# Patient Record
Sex: Male | Born: 1985 | Race: Black or African American | Hispanic: No | Marital: Single | Smoking: Current every day smoker
Health system: Southern US, Community
[De-identification: ages and names within clinical notes are randomized; demographics above are authoritative.]

## PROBLEM LIST (undated history)

## (undated) ENCOUNTER — Ambulatory Visit (HOSPITAL_COMMUNITY): Admission: EM | Payer: Self-pay | Source: Home / Self Care

## (undated) DIAGNOSIS — F419 Anxiety disorder, unspecified: Secondary | ICD-10-CM

## (undated) DIAGNOSIS — G8929 Other chronic pain: Secondary | ICD-10-CM

## (undated) DIAGNOSIS — M549 Dorsalgia, unspecified: Secondary | ICD-10-CM

---
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
## 2008-03-11 ENCOUNTER — Emergency Department (HOSPITAL_COMMUNITY): Admission: EM | Admit: 2008-03-11 | Discharge: 2008-03-11 | Payer: Self-pay | Admitting: Emergency Medicine

## 2009-06-20 ENCOUNTER — Emergency Department (HOSPITAL_COMMUNITY): Admission: EM | Admit: 2009-06-20 | Discharge: 2009-06-20 | Payer: Self-pay | Admitting: Emergency Medicine

## 2011-11-01 ENCOUNTER — Encounter (HOSPITAL_COMMUNITY): Payer: Self-pay | Admitting: Emergency Medicine

## 2011-11-01 ENCOUNTER — Emergency Department (HOSPITAL_COMMUNITY)
Admission: EM | Admit: 2011-11-01 | Discharge: 2011-11-01 | Discharge status: Against Medical Advice | Payer: Self-pay | Attending: Emergency Medicine | Admitting: Emergency Medicine

## 2011-11-01 DIAGNOSIS — R109 Unspecified abdominal pain: Secondary | ICD-10-CM | POA: Insufficient documentation

## 2011-11-01 LAB — COMPREHENSIVE METABOLIC PANEL
ALT: 23 U/L (ref 0–53)
Albumin: 4.1 g/dL (ref 3.5–5.2)
BUN: 11 mg/dL (ref 6–23)
CO2: 28 mEq/L (ref 19–32)
Calcium: 9.5 mg/dL (ref 8.4–10.5)
Creatinine, Ser: 1.18 mg/dL (ref 0.50–1.35)
Glucose, Bld: 97 mg/dL (ref 70–99)
Potassium: 3.7 mEq/L (ref 3.5–5.1)
Total Bilirubin: 0.3 mg/dL (ref 0.3–1.2)
Total Protein: 7.5 g/dL (ref 6.0–8.3)

## 2011-11-01 LAB — CBC
HCT: 45.5 % (ref 39.0–52.0)
MCH: 30.3 pg (ref 26.0–34.0)
MCV: 87.2 fL (ref 78.0–100.0)
Platelets: 198 10*3/uL (ref 150–400)
RBC: 5.22 MIL/uL (ref 4.22–5.81)

## 2011-11-01 LAB — DIFFERENTIAL
Basophils Relative: 0 % (ref 0–1)
Lymphs Abs: 1.1 10*3/uL (ref 0.7–4.0)
Monocytes Relative: 5 % (ref 3–12)
Neutro Abs: 7.8 10*3/uL — ABNORMAL HIGH (ref 1.7–7.7)

## 2011-11-01 NOTE — ED Notes (Signed)
Pt. Called and no response.

## 2011-11-01 NOTE — ED Notes (Signed)
Pt presenting to ed with c/o at the gym and felt something pop while on the incline doing sit-ups pt states he had a weight ball. Pt states pain is all over his lower abdomen. Pt states positive nausea s/p feeling pop but he denies nausea and vomiting at this time. Pt denies diarrhea

## 2013-12-28 ENCOUNTER — Encounter (HOSPITAL_COMMUNITY): Payer: Self-pay | Admitting: Emergency Medicine

## 2013-12-28 ENCOUNTER — Emergency Department (HOSPITAL_COMMUNITY)
Admission: EM | Admit: 2013-12-28 | Discharge: 2013-12-28 | Disposition: A | Discharge status: Home / Self Care | Payer: Self-pay | Attending: Emergency Medicine | Admitting: Emergency Medicine

## 2013-12-28 DIAGNOSIS — F17200 Nicotine dependence, unspecified, uncomplicated: Secondary | ICD-10-CM | POA: Insufficient documentation

## 2013-12-28 DIAGNOSIS — K5900 Constipation, unspecified: Secondary | ICD-10-CM | POA: Insufficient documentation

## 2013-12-28 DIAGNOSIS — A5900 Urogenital trichomoniasis, unspecified: Secondary | ICD-10-CM | POA: Insufficient documentation

## 2013-12-28 DIAGNOSIS — M549 Dorsalgia, unspecified: Secondary | ICD-10-CM | POA: Insufficient documentation

## 2013-12-28 DIAGNOSIS — J029 Acute pharyngitis, unspecified: Secondary | ICD-10-CM | POA: Insufficient documentation

## 2013-12-28 DIAGNOSIS — R000 Tachycardia, unspecified: Secondary | ICD-10-CM | POA: Insufficient documentation

## 2013-12-28 LAB — CBC WITH DIFFERENTIAL/PLATELET
Basophils Absolute: 0 10*3/uL (ref 0.0–0.1)
Basophils Relative: 0 % (ref 0–1)
EOS PCT: 0 % (ref 0–5)
Eosinophils Absolute: 0 10*3/uL (ref 0.0–0.7)
HCT: 42.9 % (ref 39.0–52.0)
HEMOGLOBIN: 15 g/dL (ref 13.0–17.0)
LYMPHS PCT: 10 % — AB (ref 12–46)
Lymphs Abs: 1 10*3/uL (ref 0.7–4.0)
MCH: 30.6 pg (ref 26.0–34.0)
MCHC: 35 g/dL (ref 30.0–36.0)
MCV: 87.6 fL (ref 78.0–100.0)
MONO ABS: 1 10*3/uL (ref 0.1–1.0)
MONOS PCT: 10 % (ref 3–12)
NEUTROS ABS: 7.8 10*3/uL — AB (ref 1.7–7.7)
NEUTROS PCT: 80 % — AB (ref 43–77)
Platelets: 151 10*3/uL (ref 150–400)
RBC: 4.9 MIL/uL (ref 4.22–5.81)
RDW: 11.8 % (ref 11.5–15.5)
WBC: 9.8 10*3/uL (ref 4.0–10.5)

## 2013-12-28 LAB — BASIC METABOLIC PANEL
Anion gap: 14 (ref 5–15)
BUN: 9 mg/dL (ref 6–23)
CALCIUM: 9.1 mg/dL (ref 8.4–10.5)
CO2: 26 meq/L (ref 19–32)
Chloride: 97 mEq/L (ref 96–112)
Creatinine, Ser: 1.4 mg/dL — ABNORMAL HIGH (ref 0.50–1.35)
GFR, EST AFRICAN AMERICAN: 79 mL/min — AB (ref >90)
GFR, EST NON AFRICAN AMERICAN: 68 mL/min — AB (ref >90)
Glucose, Bld: 112 mg/dL — ABNORMAL HIGH (ref 70–99)
Potassium: 3.7 mEq/L (ref 3.7–5.3)
SODIUM: 137 meq/L (ref 137–147)

## 2013-12-28 LAB — URINALYSIS, ROUTINE W REFLEX MICROSCOPIC
Glucose, UA: NEGATIVE mg/dL
Hgb urine dipstick: NEGATIVE
KETONES UR: 15 mg/dL — AB
Nitrite: NEGATIVE
PH: 6 (ref 5.0–8.0)
Protein, ur: 100 mg/dL — AB
Specific Gravity, Urine: 1.022 (ref 1.005–1.030)
UROBILINOGEN UA: 1 mg/dL (ref 0.0–1.0)

## 2013-12-28 LAB — URINE MICROSCOPIC-ADD ON

## 2013-12-28 LAB — RAPID STREP SCREEN (MED CTR MEBANE ONLY): Streptococcus, Group A Screen (Direct): NEGATIVE

## 2013-12-28 MED ORDER — METRONIDAZOLE 500 MG PO TABS: 500.0000 mg | ORAL_TABLET | Freq: Two times a day (BID) | ORAL | Status: DC

## 2013-12-28 MED ORDER — KETOROLAC TROMETHAMINE 30 MG/ML IJ SOLN
30.0000 mg | Freq: Once | INTRAMUSCULAR | Status: AC
  Administered 2013-12-28: 30 mg via INTRAVENOUS
  Filled 2013-12-28: qty 1

## 2013-12-28 MED ORDER — PROMETHAZINE HCL 25 MG PO TABS: 25.0000 mg | ORAL_TABLET | Freq: Four times a day (QID) | ORAL | Status: DC | PRN

## 2013-12-28 MED ORDER — ONDANSETRON HCL 4 MG/2ML IJ SOLN
4.0000 mg | Freq: Once | INTRAMUSCULAR | Status: AC
  Administered 2013-12-28: 4 mg via INTRAVENOUS
  Filled 2013-12-28: qty 2

## 2013-12-28 MED ORDER — OXYCODONE-ACETAMINOPHEN 5-325 MG PO TABS: 1.0000 | ORAL_TABLET | Freq: Four times a day (QID) | ORAL | Status: DC | PRN

## 2013-12-28 MED ORDER — SODIUM CHLORIDE 0.9 % IV BOLUS (SEPSIS)
1000.0000 mL | Freq: Once | INTRAVENOUS | Status: AC
  Administered 2013-12-28: 1000 mL via INTRAVENOUS

## 2013-12-28 NOTE — Discharge Instructions (Signed)
Pharyngitis Pharyngitis is redness, pain, and swelling (inflammation) of your pharynx.  CAUSES  Pharyngitis is usually caused by infection. Most of the time, these infections are from viruses (viral) and are part of a cold. However, sometimes pharyngitis is caused by bacteria (bacterial). Pharyngitis can also be caused by allergies. Viral pharyngitis may be spread from person to person by coughing, sneezing, and personal items or utensils (cups, forks, spoons, toothbrushes). Bacterial pharyngitis may be spread from person to person by more intimate contact, such as kissing.  SIGNS AND SYMPTOMS  Symptoms of pharyngitis include:   Sore throat.   Tiredness (fatigue).   Low-grade fever.   Headache.  Joint pain and muscle aches.  Skin rashes.  Swollen lymph nodes.  Plaque-like film on throat or tonsils (often seen with bacterial pharyngitis). DIAGNOSIS  Your health care provider will ask you questions about your illness and your symptoms. Your medical history, along with a physical exam, is often all that is needed to diagnose pharyngitis. Sometimes, a rapid strep test is done. Other lab tests may also be done, depending on the suspected cause.  TREATMENT  Viral pharyngitis will usually get better in 3-4 days without the use of medicine. Bacterial pharyngitis is treated with medicines that kill germs (antibiotics).  HOME CARE INSTRUCTIONS   Drink enough water and fluids to keep your urine clear or pale yellow.   Only take over-the-counter or prescription medicines as directed by your health care provider:   If you are prescribed antibiotics, make sure you finish them even if you start to feel better.   Do not take aspirin.   Get lots of rest.   Gargle with 8 oz of salt water ( tsp of salt per 1 qt of water) as often as every 1-2 hours to soothe your throat.   Throat lozenges (if you are not at risk for choking) or sprays may be used to soothe your throat. SEEK MEDICAL  CARE IF:   You have large, tender lumps in your neck.  You have a rash.  You cough up green, yellow-brown, or bloody spit. SEEK IMMEDIATE MEDICAL CARE IF:   Your neck becomes stiff.  You drool or are unable to swallow liquids.  You vomit or are unable to keep medicines or liquids down.  You have severe pain that does not go away with the use of recommended medicines.  You have trouble breathing (not caused by a stuffy nose). MAKE SURE YOU:   Understand these instructions.  Will watch your condition.  Will get help right away if you are not doing well or get worse. Document Released: 05/03/2005 Document Revised: 02/21/2013 Document Reviewed: 01/08/2013 [facility] Patient Information 2015 [place], [place]. This information is not intended to replace advice given to you by your health care provider. Make sure you discuss any questions you have with your health care provider.  Trichomoniasis Trichomoniasis is an infection caused by an organism called Trichomonas. The infection can affect both women and men. In women, the outer male genitalia and the vagina are affected. In men, the penis is mainly affected, but the prostate and other reproductive organs can also be involved. Trichomoniasis is a sexually transmitted infection (STI) and is most often passed to another person through sexual contact.  RISK FACTORS  Having unprotected sexual intercourse.  Having sexual intercourse with an infected partner. SIGNS AND SYMPTOMS  Symptoms of trichomoniasis in women include:  Abnormal gray-green frothy vaginal discharge.  Itching and irritation of the vagina.  Itching and irritation  of the area outside the vagina. Symptoms of trichomoniasis in men include:   Penile discharge with or without pain.  Pain during urination. This results from inflammation of the urethra. DIAGNOSIS  Trichomoniasis may be found during a Pap test or physical exam. Your health care provider may use one of  the following methods to help diagnose this infection:  Examining vaginal discharge under a microscope. For men, urethral discharge would be examined.  Testing the pH of the vagina with a test tape.  Using a vaginal swab test that checks for the Trichomonas organism. A test is available that provides results within a few minutes.  Doing a culture test for the organism. This is not usually needed. TREATMENT   You may be given medicine to fight the infection. Women should inform their health care provider if they could be or are pregnant. Some medicines used to treat the infection should not be taken during pregnancy.  Your health care provider may recommend over-the-counter medicines or creams to decrease itching or irritation.  Your sexual partner will need to be treated if infected. HOME CARE INSTRUCTIONS   Take medicines only as directed by your health care provider.  Take over-the-counter medicine for itching or irritation as directed by your health care provider.  Do not have sexual intercourse while you have the infection.  Women should not douche or wear tampons while they have the infection.  Discuss your infection with your partner. Your partner may have gotten the infection from you, or you may have gotten it from your partner.  Have your sex partner get examined and treated if necessary.  Practice safe, informed, and protected sex.  See your health care provider for other STI testing. SEEK MEDICAL CARE IF:   You still have symptoms after you finish your medicine.  You develop abdominal pain.  You have pain when you urinate.  You have bleeding after sexual intercourse.  You develop a rash.  Your medicine makes you sick or makes you throw up (vomit). MAKE SURE YOU:  Understand these instructions.  Will watch your condition.  Will get help right away if you are not doing well or get worse. Document Released: 10/27/2000 Document Revised: 09/17/2013 Document  Reviewed: 02/12/2013 [facility] Patient Information 2015 [place]. This information is not intended to replace advice given to you by your health care provider. Make sure you discuss any questions you have with your health care provider.

## 2013-12-28 NOTE — ED Notes (Signed)
Pt presents with lower back pain, sore throat and a headache x4 days. Pt ambulatory in triage with a steady gait but a slight limp noted. Pt reports his limp is due to his back pain. Pt had a BM today but states he feels like he still "needs to go." pt denies problems with urination

## 2013-12-28 NOTE — ED Provider Notes (Addendum)
CSN: [id number]     Arrival date & time 12/28/13  1454 History   First MD Initiated Contact with Patient 12/28/13 1908     Chief Complaint  Patient presents with  . Back Pain  . Sore Throat  . Headache     (Consider location/radiation/quality/duration/timing/severity/associated sxs/prior Treatment) Patient is a 28 y.o. male presenting with back pain, pharyngitis, and headaches. The history is provided by the patient.  Back Pain Associated symptoms: fever and headaches   Associated symptoms: no abdominal pain, no chest pain, no numbness and no weakness   Sore Throat Associated symptoms include headaches. Pertinent negatives include no chest pain, no abdominal pain and no shortness of breath.  Headache Associated symptoms: back pain, fatigue, fever, myalgias and sore throat   Associated symptoms: no abdominal pain, no cough, no diarrhea, no pain, no nausea, no neck stiffness, no numbness and no vomiting    patient with multiple complaints the last few days. Started with a sore throat. He is also had some nausea or vomiting. He set some constipation. He is back pain. Headaches. He has myalgias. He's had fevers. He states someone else he works with has a sore throat also. No difficulty breathing.  History reviewed. No pertinent past medical history. History reviewed. No pertinent past surgical history. History reviewed. No pertinent family history. History  Substance Use Topics  . Smoking status: Current Some Day Smoker    Types: Cigarettes  . Smokeless tobacco: Not on file  . Alcohol Use: Yes     Comment: occassionally    Review of Systems  Constitutional: Positive for fever, appetite change and fatigue. Negative for activity change.  HENT: Positive for sore throat.   Eyes: Negative for pain.  Respiratory: Negative for cough, chest tightness and shortness of breath.   Cardiovascular: Negative for chest pain and leg swelling.  Gastrointestinal: Negative for nausea, vomiting,  abdominal pain and diarrhea.  Genitourinary: Negative for flank pain.  Musculoskeletal: Positive for back pain and myalgias. Negative for neck stiffness.  Skin: Negative for rash.  Neurological: Positive for headaches. Negative for weakness and numbness.  Psychiatric/Behavioral: Negative for behavioral problems.      Allergies  Review of patient's allergies indicates no known allergies.  Home Medications   Prior to Admission medications   Medication Sig Start Date End Date Taking? Authorizing Provider  Pseudoeph-CPM-DM-APAP (THERAFLU FLU/COLD/COUGH PO) Take 30 mLs by mouth daily as needed (for cold).   Yes Historical Provider, MD   BP 116/49  Pulse 63  Temp(Src) 99.7 F (37.6 C) (Oral)  Resp 18  Ht 6' (1.829 m)  Wt 160 lb 1.6 oz (72.621 kg)  BMI 21.71 kg/m2  SpO2 100% Physical Exam  Nursing note and vitals reviewed. Constitutional: He is oriented to person, place, and time. He appears well-developed and well-nourished.  Patient appears uncomfortable  HENT:  Head: Normocephalic and atraumatic.  Posterior pharyngeal erythema without exudate.  Eyes: EOM are normal. Pupils are equal, round, and reactive to light.  Neck: Normal range of motion. Neck supple.  Cardiovascular: Regular rhythm and normal heart sounds.   No murmur heard. Mild tachycardia  Pulmonary/Chest: Effort normal and breath sounds normal.  Abdominal: Soft. Bowel sounds are normal. He exhibits no distension and no mass. There is no tenderness. There is no rebound and no guarding.  Musculoskeletal: Normal range of motion. He exhibits no edema.  Neurological: He is alert and oriented to person, place, and time. No cranial nerve deficit.  Skin: Skin is warm and dry.  Psychiatric: He has a normal mood and affect.    ED Course  Procedures (including critical care time) Labs Review Labs Reviewed  CBC WITH DIFFERENTIAL - Abnormal; Notable for the following:    Neutrophils Relative % 80 (*)    Neutro Abs 7.8  (*)    Lymphocytes Relative 10 (*)    All other components within normal limits  BASIC METABOLIC PANEL - Abnormal; Notable for the following:    Glucose, Bld 112 (*)    Creatinine, Ser 1.40 (*)    GFR calc non Af Amer 68 (*)    GFR calc Af Amer 79 (*)    All other components within normal limits  URINALYSIS, ROUTINE W REFLEX MICROSCOPIC - Abnormal; Notable for the following:    Color, Urine AMBER (*)    APPearance CLOUDY (*)    Bilirubin Urine SMALL (*)    Ketones, ur 15 (*)    Protein, ur 100 (*)    Leukocytes, UA TRACE (*)    All other components within normal limits  URINE MICROSCOPIC-ADD ON - Abnormal; Notable for the following:    Squamous Epithelial / LPF FEW (*)    Bacteria, UA FEW (*)    All other components within normal limits  RAPID STREP SCREEN  CULTURE, GROUP A STREP    Imaging Review No results found.   EKG Interpretation None      MDM   Final diagnoses:  None     patient with likely viral syndrome/pharyngitis feels better after treatment. He has tolerated orals. Will discharge home with antiemetics and pain medication. Negative strep test     Juliet Rude. Rubin Payor, MD 12/28/13 2124  Patient has trich in the urine. States that his wife has had the same thing. Will give antibiotics. Patient was instructed on followup for other STDs  Juliet Rude. Rubin Payor, MD 12/28/13 2129

## 2013-12-28 NOTE — ED Notes (Signed)
Pt also reports unable to eat or drink, fever and nights sweats since Tuesday

## 2013-12-28 NOTE — ED Notes (Signed)
Pt given ginger ale.  IV infusion without difficulty.  Main c/o back pain and sore throat. + low grade temp.

## 2013-12-30 LAB — URINE CULTURE
CULTURE: NO GROWTH
Colony Count: NO GROWTH

## 2013-12-30 LAB — CULTURE, GROUP A STREP

## 2014-12-02 ENCOUNTER — Encounter (HOSPITAL_COMMUNITY): Payer: Self-pay

## 2014-12-02 ENCOUNTER — Emergency Department (HOSPITAL_COMMUNITY)
Admission: EM | Admit: 2014-12-02 | Discharge: 2014-12-03 | Disposition: A | Discharge status: Home / Self Care | Payer: Self-pay | Attending: Emergency Medicine | Admitting: Emergency Medicine

## 2014-12-02 DIAGNOSIS — F1994 Other psychoactive substance use, unspecified with psychoactive substance-induced mood disorder: Secondary | ICD-10-CM | POA: Diagnosis present

## 2014-12-02 DIAGNOSIS — F419 Anxiety disorder, unspecified: Secondary | ICD-10-CM | POA: Insufficient documentation

## 2014-12-02 DIAGNOSIS — R45851 Suicidal ideations: Secondary | ICD-10-CM

## 2014-12-02 DIAGNOSIS — F12188 Cannabis abuse with other cannabis-induced disorder: Secondary | ICD-10-CM | POA: Insufficient documentation

## 2014-12-02 DIAGNOSIS — Z720 Tobacco use: Secondary | ICD-10-CM | POA: Insufficient documentation

## 2014-12-02 DIAGNOSIS — F1510 Other stimulant abuse, uncomplicated: Secondary | ICD-10-CM | POA: Insufficient documentation

## 2014-12-02 DIAGNOSIS — F22 Delusional disorders: Secondary | ICD-10-CM | POA: Insufficient documentation

## 2014-12-02 DIAGNOSIS — F1910 Other psychoactive substance abuse, uncomplicated: Secondary | ICD-10-CM

## 2014-12-02 DIAGNOSIS — F1610 Hallucinogen abuse, uncomplicated: Secondary | ICD-10-CM | POA: Diagnosis present

## 2014-12-02 NOTE — ED Provider Notes (Signed)
History  This chart was scribed for non-physician practitioner, Francee PiccoloJennifer Niki Cosman, PA-C,working with Alvira MondayErin Schlossman, MD, by Karle PlumberJennifer Tensley, ED Scribe. This patient was seen in room WTR4/WLPT4 and the patient's care was started at 11:33 PM.  Chief Complaint  Patient presents with  . Medical Clearance   The history is provided by the patient and medical records. No language interpreter was used.    HPI Comments:  Antonio Wright is a 29 y.o. male brought in by EMS, who presents to the Emergency Department stating he took some Molly and smoked marijuana PTA. He states he cannot remember what time he took the [place]. Pt reports he has had thoughts of hurting himself and feels like people are after him. He states he felt this way even before taking the drugs. He denies SI, HI, self-cutting, CP, SOB, fever, chills, nausea or vomiting. He denies alcohol abuse or any other illicit drugs.   History reviewed. No pertinent past medical history. History reviewed. No pertinent past surgical history. History reviewed. No pertinent family history. History  Substance Use Topics  . Smoking status: Current Some Day Smoker    Types: Cigarettes  . Smokeless tobacco: Not on file  . Alcohol Use: Yes     Comment: occassionally    Review of Systems  Constitutional: Negative for fever and chills.  Respiratory: Negative for shortness of breath.   Cardiovascular: Negative for chest pain.  Gastrointestinal: Negative for nausea and vomiting.  Psychiatric/Behavioral: Positive for hallucinations. The patient is nervous/anxious.   All other systems reviewed and are negative.   Allergies  Review of patient's allergies indicates no known allergies.  Home Medications   Prior to Admission medications   Medication Sig Start Date End Date Taking? Authorizing Provider  metroNIDAZOLE (FLAGYL) 500 MG tablet Take 1 tablet (500 mg total) by mouth 2 (two) times daily. Patient not taking: Reported on 12/02/2014  12/28/13   Benjiman CoreNathan Pickering, MD  oxyCODONE-acetaminophen (PERCOCET/ROXICET) 5-325 MG per tablet Take 1-2 tablets by mouth every 6 (six) hours as needed for severe pain. Patient not taking: Reported on 12/02/2014 12/28/13   Benjiman CoreNathan Pickering, MD  promethazine (PHENERGAN) 25 MG tablet Take 1 tablet (25 mg total) by mouth every 6 (six) hours as needed for nausea. Patient not taking: Reported on 12/02/2014 12/28/13   Benjiman CoreNathan Pickering, MD  Pseudoeph-CPM-DM-APAP [facility](THERAFLU FLU/COLD/COUGH PO) Take 30 mLs by mouth daily as needed (for cold).    Historical Provider, MD   Triage Vitals: BP 130/69 mmHg  Pulse 87  Temp(Src) 97.8 F (36.6 C) (Oral)  Resp 18  SpO2 100% Physical Exam  Constitutional: He is oriented to person, place, and time. He appears well-developed and well-nourished. No distress.  HENT:  Head: Normocephalic and atraumatic.  Right Ear: External ear normal.  Left Ear: External ear normal.  Nose: Nose normal.  Mouth/Throat: Oropharynx is clear and moist.  Eyes: Conjunctivae are normal.  Neck: Normal range of motion. Neck supple.  No nuchal rigidity.   Cardiovascular: Normal rate, regular rhythm and normal heart sounds.   No murmur heard. Pulmonary/Chest: Effort normal and breath sounds normal. No respiratory distress.  Abdominal: Soft.  Musculoskeletal: Normal range of motion.  Neurological: He is alert and oriented to person, place, and time.  Skin: Skin is warm and dry. He is not diaphoretic.  Psychiatric: His mood appears anxious. Thought content is paranoid.  Nursing note and vitals reviewed.   ED Course  Procedures (including critical care time) DIAGNOSTIC STUDIES: Oxygen Saturation is 100% on RA, normal by  my interpretation.   COORDINATION OF CARE: 11:36 PM- Will order standard medical clearance labs. Pt verbalizes understanding and agrees to plan.  Medications - No data to display  Labs Review Labs Reviewed  COMPREHENSIVE METABOLIC PANEL - Abnormal; Notable for the  following:    Potassium 3.4 (*)    Glucose, Bld 120 (*)    Creatinine, Ser 1.35 (*)    AST 49 (*)    All other components within normal limits  URINE RAPID DRUG SCREEN, HOSP PERFORMED - Abnormal; Notable for the following:    Tetrahydrocannabinol POSITIVE (*)    All other components within normal limits  ACETAMINOPHEN LEVEL - Abnormal; Notable for the following:    Acetaminophen (Tylenol), Serum <10 (*)    All other components within normal limits  CBC  ETHANOL  SALICYLATE LEVEL    Imaging Review No results found.   EKG Interpretation None      3:28 AM Meets inpatient criteria, awaiting sobering up prior to placement, psychiatry will see in the AM. Will hold in ED until then.   MDM   Final diagnoses:  Substance abuse  Suicidal ideations   Filed Vitals:   12/02/14 2253  BP: 130/69  Pulse: 87  Temp: 97.8 F (36.6 C)  Resp: 18   Afebrile, NAD, non-toxic appearing, AAOx4.  I have reviewed nursing notes, vital signs, and all lab results as noted above.  Pt presents to the ED for medical clearance.    The patient currently does not have any acute physical complaints and is in no acute distress. The patient was brought to ED by self an is seeking voluntarily seeking placement/behavioral health assistance.  TTS consult was appreciated and pt was moved to Psych ED for further evaluation.      I personally performed the services described in this documentation, which was scribed in my presence. The recorded information has been reviewed and is accurate.    Francee Piccolo, PA-C 12/03/14 1610  Alvira Monday, MD 12/03/14 1345

## 2014-12-02 NOTE — ED Notes (Signed)
Pt was found walking down [address] by [employer], apparently high on Molly, pt has no complaints, [place] and [place] walking

## 2014-12-03 DIAGNOSIS — F1510 Other stimulant abuse, uncomplicated: Secondary | ICD-10-CM

## 2014-12-03 DIAGNOSIS — F1610 Hallucinogen abuse, uncomplicated: Secondary | ICD-10-CM | POA: Diagnosis present

## 2014-12-03 DIAGNOSIS — F1994 Other psychoactive substance use, unspecified with psychoactive substance-induced mood disorder: Secondary | ICD-10-CM

## 2014-12-03 LAB — RAPID URINE DRUG SCREEN, HOSP PERFORMED
AMPHETAMINES: NOT DETECTED
BENZODIAZEPINES: NOT DETECTED
Barbiturates: NOT DETECTED
COCAINE: NOT DETECTED
Opiates: NOT DETECTED
TETRAHYDROCANNABINOL: POSITIVE — AB

## 2014-12-03 LAB — SALICYLATE LEVEL

## 2014-12-03 LAB — COMPREHENSIVE METABOLIC PANEL
ALK PHOS: 53 U/L (ref 38–126)
ALT: 27 U/L (ref 17–63)
AST: 49 U/L — AB (ref 15–41)
Albumin: 4.7 g/dL (ref 3.5–5.0)
Anion gap: 7 (ref 5–15)
BILIRUBIN TOTAL: 0.9 mg/dL (ref 0.3–1.2)
BUN: 11 mg/dL (ref 6–20)
CO2: 27 mmol/L (ref 22–32)
Calcium: 9.5 mg/dL (ref 8.9–10.3)
Chloride: 104 mmol/L (ref 101–111)
Creatinine, Ser: 1.35 mg/dL — ABNORMAL HIGH (ref 0.61–1.24)
GFR calc Af Amer: >60 mL/min (ref >60)
Glucose, Bld: 120 mg/dL — ABNORMAL HIGH (ref 65–99)
POTASSIUM: 3.4 mmol/L — AB (ref 3.5–5.1)
Sodium: 138 mmol/L (ref 135–145)
TOTAL PROTEIN: 7.8 g/dL (ref 6.5–8.1)

## 2014-12-03 LAB — CBC
HCT: 40 % (ref 39.0–52.0)
HEMOGLOBIN: 13.4 g/dL (ref 13.0–17.0)
MCH: 29.8 pg (ref 26.0–34.0)
MCHC: 33.5 g/dL (ref 30.0–36.0)
MCV: 88.9 fL (ref 78.0–100.0)
PLATELETS: 173 10*3/uL (ref 150–400)
RBC: 4.5 MIL/uL (ref 4.22–5.81)
RDW: 12.2 % (ref 11.5–15.5)
WBC: 7.6 10*3/uL (ref 4.0–10.5)

## 2014-12-03 LAB — ACETAMINOPHEN LEVEL: Acetaminophen (Tylenol), Serum: <10 ug/mL — ABNORMAL LOW (ref 10–30)

## 2014-12-03 LAB — ETHANOL: Alcohol, Ethyl (B): <5 mg/dL (ref <5)

## 2014-12-03 NOTE — ED Notes (Signed)
Pt receiving TTs consult
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
## 2014-12-03 NOTE — [facility] Suicide Risk Assessment (Signed)
Suicide Risk Assessment  Discharge Assessment   [facility]   Demographic Factors:  Male and Adolescent or young adult  Total Time spent with patient: 45 minutes  Musculoskeletal: Strength & Muscle Tone: within normal limits Gait & Station: normal Patient leans: N/A  Psychiatric Specialty Exam: Physical Exam  Review of Systems  Constitutional: Negative.  HENT: Negative.  Eyes: Negative.  Respiratory: Negative.  Cardiovascular: Negative.  Gastrointestinal: Negative.  Genitourinary: Negative.  Musculoskeletal: Negative.  Skin: Negative.  Neurological: Negative.  Endo/Heme/Allergies: Negative.  Psychiatric/Behavioral: Positive for substance abuse.    Blood pressure 118/72, pulse 64, temperature 98.1 F (36.7 C), temperature source Oral, resp. rate 19, SpO2 100 %.There is no weight on file to calculate BMI.  General Appearance: Casual  Eye Contact:: Good  Speech: Normal Rate  Volume: Normal  Mood: Anxious  Affect: Congruent  Thought Process: Coherent  Orientation: Full (Time, Place, and Person)  Thought Content: WDL  Suicidal Thoughts: No  Homicidal Thoughts: No  Memory: Immediate; Good Recent; Good Remote; Good  Judgement: Fair  Insight: Good  Psychomotor Activity: Normal  Concentration: Good  Recall: Good  Fund of Knowledge:Good  Language: Good  Akathisia: No  Handed: Right  AIMS (if indicated):    Assets: Leisure Time Physical Health Resilience Social Support  ADL's: Intact  Cognition: WNL  Sleep:             Has this patient used any form of tobacco in the last 30 days? (Cigarettes, Smokeless Tobacco, Cigars, and/or Pipes) Yes, A prescription for an FDA-approved tobacco cessation medication was offered at discharge and the patient refused  Mental Status Per Nursing Assessment::   On Admission:   MDMA abuse  Current Mental Status by  Physician: NA  Loss Factors: NA  Historical Factors: NA  Risk Reduction Factors:   Responsible for children under 27 years of age, Sense of responsibility to family, Living with another person, especially a relative and Positive social support  Continued Clinical Symptoms:  Anxiety, mild  Cognitive Features That Contribute To Risk:  None    Suicide Risk:  Minimal: No identifiable suicidal ideation.  Patients presenting with no risk factors but with morbid ruminations; may be classified as minimal risk based on the severity of the depressive symptoms  Principal Problem: Substance induced mood disorder Discharge Diagnoses:  Patient Active Problem List   Diagnosis Date Noted  . Substance induced mood disorder [F19.94] 12/03/2014    Priority: High  . 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

## 2014-12-03 NOTE — ED Notes (Signed)
Patient discharged to home.  He denies thoughts of harm to self or others.  He denies hallucinations.  All belongings returned and signed for.  He was [place] [employer] bus pass.  He left the unit ambulatory and was escorted to the lobby.

## 2015-08-20 ENCOUNTER — Emergency Department (HOSPITAL_COMMUNITY)
Admission: EM | Admit: 2015-08-20 | Discharge: 2015-08-20 | Disposition: A | Discharge status: Home / Self Care | Payer: Self-pay | Attending: Emergency Medicine | Admitting: Emergency Medicine

## 2015-08-20 ENCOUNTER — Emergency Department (HOSPITAL_COMMUNITY): Payer: Self-pay

## 2015-08-20 ENCOUNTER — Encounter (HOSPITAL_COMMUNITY): Payer: Self-pay

## 2015-08-20 DIAGNOSIS — R112 Nausea with vomiting, unspecified: Secondary | ICD-10-CM | POA: Insufficient documentation

## 2015-08-20 DIAGNOSIS — R197 Diarrhea, unspecified: Secondary | ICD-10-CM | POA: Insufficient documentation

## 2015-08-20 DIAGNOSIS — R1013 Epigastric pain: Secondary | ICD-10-CM | POA: Insufficient documentation

## 2015-08-20 DIAGNOSIS — R05 Cough: Secondary | ICD-10-CM | POA: Insufficient documentation

## 2015-08-20 DIAGNOSIS — R6889 Other general symptoms and signs: Secondary | ICD-10-CM

## 2015-08-20 DIAGNOSIS — J029 Acute pharyngitis, unspecified: Secondary | ICD-10-CM | POA: Insufficient documentation

## 2015-08-20 DIAGNOSIS — F17210 Nicotine dependence, cigarettes, uncomplicated: Secondary | ICD-10-CM | POA: Insufficient documentation

## 2015-08-20 DIAGNOSIS — R5383 Other fatigue: Secondary | ICD-10-CM | POA: Insufficient documentation

## 2015-08-20 DIAGNOSIS — R067 Sneezing: Secondary | ICD-10-CM | POA: Insufficient documentation

## 2015-08-20 LAB — CBC WITH DIFFERENTIAL/PLATELET
BASOS ABS: 0 10*3/uL (ref 0.0–0.1)
Basophils Relative: 0 %
EOS PCT: 2 %
Eosinophils Absolute: 0.1 10*3/uL (ref 0.0–0.7)
HEMATOCRIT: 41.4 % (ref 39.0–52.0)
Hemoglobin: 14.6 g/dL (ref 13.0–17.0)
LYMPHS ABS: 1.7 10*3/uL (ref 0.7–4.0)
LYMPHS PCT: 46 %
MCH: 31 pg (ref 26.0–34.0)
MCHC: 35.3 g/dL (ref 30.0–36.0)
MCV: 87.9 fL (ref 78.0–100.0)
MONO ABS: 0.3 10*3/uL (ref 0.1–1.0)
MONOS PCT: 7 %
Neutro Abs: 1.7 10*3/uL (ref 1.7–7.7)
Neutrophils Relative %: 45 %
PLATELETS: 149 10*3/uL — AB (ref 150–400)
RBC: 4.71 MIL/uL (ref 4.22–5.81)
RDW: 12 % (ref 11.5–15.5)
WBC: 3.8 10*3/uL — ABNORMAL LOW (ref 4.0–10.5)

## 2015-08-20 LAB — COMPREHENSIVE METABOLIC PANEL
ALT: 20 U/L (ref 17–63)
AST: 21 U/L (ref 15–41)
Albumin: 4.1 g/dL (ref 3.5–5.0)
Alkaline Phosphatase: 62 U/L (ref 38–126)
Anion gap: 7 (ref 5–15)
BUN: 6 mg/dL (ref 6–20)
CHLORIDE: 107 mmol/L (ref 101–111)
CO2: 27 mmol/L (ref 22–32)
CREATININE: 1.13 mg/dL (ref 0.61–1.24)
Calcium: 9.1 mg/dL (ref 8.9–10.3)
GFR calc Af Amer: >60 mL/min (ref >60)
Glucose, Bld: 99 mg/dL (ref 65–99)
Potassium: 3.9 mmol/L (ref 3.5–5.1)
Sodium: 141 mmol/L (ref 135–145)
TOTAL PROTEIN: 7.2 g/dL (ref 6.5–8.1)
Total Bilirubin: 0.5 mg/dL (ref 0.3–1.2)

## 2015-08-20 LAB — URINALYSIS, ROUTINE W REFLEX MICROSCOPIC
BILIRUBIN URINE: NEGATIVE
Glucose, UA: NEGATIVE mg/dL
HGB URINE DIPSTICK: NEGATIVE
KETONES UR: NEGATIVE mg/dL
Leukocytes, UA: NEGATIVE
Nitrite: NEGATIVE
PH: 6.5 (ref 5.0–8.0)
PROTEIN: NEGATIVE mg/dL
Specific Gravity, Urine: 1.023 (ref 1.005–1.030)

## 2015-08-20 LAB — LIPASE, BLOOD: LIPASE: 15 U/L (ref 11–51)

## 2015-08-20 MED ORDER — ONDANSETRON HCL 4 MG PO TABS: 4.0000 mg | ORAL_TABLET | Freq: Four times a day (QID) | ORAL | Status: DC

## 2015-08-20 MED ORDER — IBUPROFEN 800 MG PO TABS
800.0000 mg | ORAL_TABLET | Freq: Once | ORAL | Status: AC
  Administered 2015-08-20: 800 mg via ORAL
  Filled 2015-08-20: qty 1

## 2015-08-20 MED ORDER — ONDANSETRON HCL 4 MG PO TABS
4.0000 mg | ORAL_TABLET | Freq: Once | ORAL | Status: AC
  Administered 2015-08-20: 4 mg via ORAL
  Filled 2015-08-20: qty 1

## 2015-08-20 MED ORDER — IBUPROFEN 800 MG PO TABS: 800.0000 mg | ORAL_TABLET | Freq: Three times a day (TID) | ORAL | Status: DC

## 2015-08-20 NOTE — ED Provider Notes (Signed)
CSN: [id number]     Arrival date & time 08/20/15  1313 History  By signing my name below, I, Tanda Rockers, attest that this documentation has been prepared under the direction and in the presence of Cheri Fowler, PA-C. Electronically Signed: Tanda Rockers, ED Scribe. 08/20/2015. 1:35 PM.   Chief Complaint  Patient presents with  . Cough  . Sore Throat   The history is provided by the patient. No language interpreter was used.     HPI Comments: Antonio Wright is a 30 y.o. male brought in by ambulance, who presents to the Emergency Department complaining of gradual onset, intermittent, flu like symptoms including cough, sore throat, sneezing, body aches, and fatigue x 4 days. Pt also complains of epigastric abdominal pain, nausea, vomiting, and diarrhea that began yesterday. He had 6 episodes of vomiting yesterday but none today. Pt also had 3 episodes of loose stools this morning. Positive recent sick contact with similar symptoms. Pt has been taking Tylenol without relief. Denies fever, CP, SOB, hematochezia, hematemesis, dysuria, hematuria, or any other associated symptoms.    History reviewed. No pertinent past medical history. History reviewed. No pertinent past surgical history. No family history on file. Social History  Substance Use Topics  . Smoking status: Current Some Day Smoker    Types: Cigarettes  . Smokeless tobacco: None  . Alcohol Use: Yes     Comment: occassionally    Review of Systems  Constitutional: Negative for fever.  HENT: Positive for sneezing and sore throat.   Respiratory: Positive for cough. Negative for shortness of breath.   Cardiovascular: Negative for chest pain.  Gastrointestinal: Positive for nausea, vomiting, abdominal pain and diarrhea. Negative for blood in stool.  Genitourinary: Negative for dysuria and hematuria.  All other systems reviewed and are negative.  Allergies  Review of patient's allergies indicates no known allergies.  Home  Medications   Prior to Admission medications   Medication Sig Start Date End Date Taking? Authorizing Provider  metroNIDAZOLE (FLAGYL) 500 MG tablet Take 1 tablet (500 mg total) by mouth 2 (two) times daily. Patient not taking: Reported on 12/02/2014 12/28/13   Benjiman Core, MD  oxyCODONE-acetaminophen (PERCOCET/ROXICET) 5-325 MG per tablet Take 1-2 tablets by mouth every 6 (six) hours as needed for severe pain. Patient not taking: Reported on 12/02/2014 12/28/13   Benjiman Core, MD  promethazine (PHENERGAN) 25 MG tablet Take 1 tablet (25 mg total) by mouth every 6 (six) hours as needed for nausea. Patient not taking: Reported on 12/02/2014 12/28/13   Benjiman Core, MD  Pseudoeph-CPM-DM-APAP [facility] FLU/COLD/COUGH PO) Take 30 mLs by mouth daily as needed (for cold).    Historical Provider, MD   BP 125/65 mmHg  Pulse 45  Temp(Src) 97.5 F (36.4 C) (Oral)  Resp 18  SpO2 100%   Physical Exam  Constitutional: He is oriented to person, place, and time. He appears well-developed and well-nourished.  Non-toxic appearance. He does not have a sickly appearance. He does not appear ill.  HENT:  Head: Normocephalic and atraumatic.  Right Ear: Tympanic membrane and external ear normal.  Left Ear: Tympanic membrane and external ear normal.  Nose: Nose normal.  Mouth/Throat: Uvula is midline and oropharynx is clear and moist. Mucous membranes are dry. No trismus in the jaw. No oropharyngeal exudate, posterior oropharyngeal edema or posterior oropharyngeal erythema.  Eyes: Conjunctivae are normal. Pupils are equal, round, and reactive to light.  Neck: Normal range of motion. Neck supple.  Cardiovascular: Normal rate, regular rhythm and normal  heart sounds.   No murmur heard. Pulmonary/Chest: Effort normal and breath sounds normal. No accessory muscle usage or stridor. No respiratory distress. He has no wheezes. He has no rhonchi. He has no rales.  Abdominal: Soft. Bowel sounds are normal. He  exhibits no distension. There is no tenderness. There is no rebound and no guarding.  Musculoskeletal: Normal range of motion.  Lymphadenopathy:    He has no cervical adenopathy.  Neurological: He is alert and oriented to person, place, and time.  Speech clear without dysarthria.  Skin: Skin is warm and dry.  Psychiatric: He has a normal mood and affect. His behavior is normal.    ED Course  Procedures (including critical care time)  DIAGNOSTIC STUDIES: Oxygen Saturation is 100% on RA, normal by my interpretation.    COORDINATION OF CARE: 1:34 PM-Discussed treatment plan which includes CXR, Lipase, CBC, UA, and CMP with pt at bedside and pt agreed to plan.   Labs Review Labs Reviewed  CBC WITH DIFFERENTIAL/PLATELET - Abnormal; Notable for the following:    WBC 3.8 (*)    Platelets 149 (*)    All other components within normal limits  COMPREHENSIVE METABOLIC PANEL  LIPASE, BLOOD  URINALYSIS, ROUTINE W REFLEX MICROSCOPIC (NOT [facility])    Imaging Review Dg Chest 2 View  08/20/2015  CLINICAL DATA:  Cough for 4 days EXAM: CHEST  2 VIEW COMPARISON:  None. FINDINGS: The heart size and mediastinal contours are within normal limits. Both lungs are clear. The visualized skeletal structures are unremarkable. IMPRESSION: No active cardiopulmonary disease. Electronically Signed   By: Alcide CleverMark  Lukens M.D.   On: 08/20/2015 13:41   I have personally reviewed and evaluated these images and lab results as part of my medical decision-making.   EKG Interpretation None      MDM   Final diagnoses:  Flu-like symptoms  Epigastric pain  Non-intractable vomiting with nausea, vomiting of unspecified type   Patient presents with 4 days of flu like symptoms without fever.  Afebrile, VSS.  Patient appears well, non-toxic.  Mucous membranes appear dry.  Heart RRR, lungs CTAB, abdomen soft and benign without rebound, guarding, or rigidity.  Doubt surgical abdomen.  Labs and CXR without acute  abnormalities.  Low suspicion for acute intra-abdominal etiology.  Likely enteritis.  Patient given Zofran and successfully able to tolerate POs.  Plan to discharge home with Zofran and Ibuprofen.  Discussed return precautions.  Patient given follow up with [facility].  Patient agrees and acknowledges the above plan for discharge.   I personally performed the services described in this documentation, which was scribed in my presence. The recorded information has been reviewed and is accurate.      Cheri FowlerKayla Devan Babino, PA-C 08/20/15 1523  Raeford RazorStephen Kohut, MD 08/25/15 [phone]

## 2015-08-20 NOTE — ED Notes (Signed)
Patient c/o cough, sore throat, body aches x 4-5 days.  Patient denies fever.  Patient also reports intermittent nausea.

## 2015-08-20 NOTE — ED Notes (Signed)
Patient was alert, oriented and stable upon discharge. RN went over AVS and patient had no further questions.  

## 2015-08-20 NOTE — Discharge Instructions (Signed)

## 2015-11-06 ENCOUNTER — Encounter (HOSPITAL_COMMUNITY): Payer: Self-pay | Admitting: Emergency Medicine

## 2015-11-06 ENCOUNTER — Emergency Department (HOSPITAL_COMMUNITY)
Admission: EM | Admit: 2015-11-06 | Discharge: 2015-11-07 | Disposition: A | Discharge status: Home / Self Care | Payer: Self-pay | Attending: Dermatology | Admitting: Dermatology

## 2015-11-06 DIAGNOSIS — M549 Dorsalgia, unspecified: Secondary | ICD-10-CM | POA: Insufficient documentation

## 2015-11-06 DIAGNOSIS — Z5321 Procedure and treatment not carried out due to patient leaving prior to being seen by health care provider: Secondary | ICD-10-CM | POA: Insufficient documentation

## 2015-11-06 HISTORY — DX: Other chronic pain: G89.29

## 2015-11-06 HISTORY — DX: Dorsalgia, unspecified: M54.9

## 2015-11-06 NOTE — ED Notes (Signed)
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

## 2015-11-07 ENCOUNTER — Encounter (HOSPITAL_COMMUNITY): Payer: Self-pay | Admitting: Emergency Medicine

## 2015-11-07 ENCOUNTER — Emergency Department (HOSPITAL_COMMUNITY)
Admission: EM | Admit: 2015-11-07 | Discharge: 2015-11-07 | Disposition: A | Discharge status: Home / Self Care | Payer: Self-pay | Attending: Emergency Medicine | Admitting: Emergency Medicine

## 2015-11-07 DIAGNOSIS — F1290 Cannabis use, unspecified, uncomplicated: Secondary | ICD-10-CM | POA: Insufficient documentation

## 2015-11-07 DIAGNOSIS — M545 Low back pain, unspecified: Secondary | ICD-10-CM

## 2015-11-07 DIAGNOSIS — F17210 Nicotine dependence, cigarettes, uncomplicated: Secondary | ICD-10-CM | POA: Insufficient documentation

## 2015-11-07 MED ORDER — METHOCARBAMOL 500 MG PO TABS: 500.0000 mg | ORAL_TABLET | Freq: Two times a day (BID) | ORAL | Status: DC | PRN

## 2015-11-07 MED ORDER — NAPROXEN 500 MG PO TABS: 500.0000 mg | ORAL_TABLET | Freq: Two times a day (BID) | ORAL | Status: DC

## 2015-11-07 NOTE — ED Provider Notes (Signed)
CSN: [id number]     Arrival date & time 11/07/15  1219 History  By signing my name below, I, Emmanuella Mensah, attest that this documentation has been prepared under the direction and in the presence of Everlene FarrierWilliam Starasia Sinko, PA-C. Electronically Signed: Angelene GiovanniEmmanuella Mensah, ED Scribe. 11/07/2015. 2:13 PM.     Chief Complaint  Patient presents with  . Back Pain   The history is provided by the patient. No language interpreter was used.   HPI Comments: Antonio Wright is a 30 y.o. male who presents to the Emergency Department complaining of gradually worsening severe bilateral lower pain s/p twisting and bending while heavy lifting at work  onset 4 days ago. He explains that he was lifting something heavy at work as he was bending and twisting prior to onset. He adds that he has been having intermittent lower back pain for approx 2 years and he lifts heavy boxes at work all the time but his pain became significantly worse 4 days ago. He reports that the pain is worse with movement. He currently complains of one out of 10 back pain that is worse with movement. No gait problems. No alleviating factors noted. Pt has not tried any medications PTA. He denies any IV drug use. He denies any fever, bowel/bladder incontinence, urinary symptoms, abdominal pain, n/v/d, numbness, tingling, or weakness.    Past Medical History  Diagnosis Date  . Chronic back pain    History reviewed. No pertinent past surgical history. Family History  Problem Relation Age of Onset  . Diabetes Father   . Diabetes Other    Social History  Substance Use Topics  . Smoking status: Current Some Day Smoker    Types: Cigarettes  . Smokeless tobacco: None  . Alcohol Use: Yes     Comment: occassionally    Review of Systems  Constitutional: Negative for fever.  Eyes: Negative for visual disturbance.  Respiratory: Negative for cough and shortness of breath.   Cardiovascular: Negative for chest pain.  Gastrointestinal: Negative for  nausea, vomiting, abdominal pain and diarrhea.  Genitourinary: Negative for dysuria, frequency, hematuria and difficulty urinating.  Musculoskeletal: Positive for back pain. Negative for joint swelling, neck pain and neck stiffness.  Skin: Negative for rash and wound.  Neurological: Negative for weakness and numbness.      Allergies  Review of patient's allergies indicates no known allergies.  Home Medications   Prior to Admission medications   Medication Sig Start Date End Date Taking? Authorizing Provider  methocarbamol (ROBAXIN) 500 MG tablet Take 1 tablet (500 mg total) by mouth 2 (two) times daily as needed for muscle spasms. 11/07/15   Everlene FarrierWilliam Rayshawn Maney, PA-C  naproxen (NAPROSYN) 500 MG tablet Take 1 tablet (500 mg total) by mouth 2 (two) times daily with a meal. 11/07/15   Everlene FarrierWilliam Serina Nichter, PA-C   BP 113/73 mmHg  Pulse 50  Temp(Src) 98 F (36.7 C) (Oral)  Resp 16  SpO2 100% Physical Exam  Constitutional: He appears well-developed and well-nourished. No distress.  Nontoxic appearing.  HENT:  Head: Normocephalic and atraumatic.  Eyes: Conjunctivae are normal. Pupils are equal, round, and reactive to light. Right eye exhibits no discharge. Left eye exhibits no discharge.  Neck: Normal range of motion. Neck supple.  Cardiovascular: Normal rate, regular rhythm, normal heart sounds and intact distal pulses.   Pulmonary/Chest: Effort normal and breath sounds normal. No respiratory distress. He has no wheezes. He has no rales.  Abdominal: Soft. There is no tenderness. There is no guarding.  Musculoskeletal: Normal range of motion. He exhibits tenderness. He exhibits no edema.  Tenderness to bilateral lumbar musculature  No midline spine tenderness No calf edema tenderness Good strength his bilateral lower extremities. No back erythema, deformity, ecchymosis or warmth.  Lymphadenopathy:    He has no cervical adenopathy.  Neurological: He is alert. He has normal reflexes. He displays  normal reflexes. Coordination normal.  Sensation is intact in his bilateral lower extremities. Normal gait. Bilateral patellar DTRs are intact.  Skin: Skin is warm and dry. No rash noted. He is not diaphoretic. No erythema. No pallor.  Psychiatric: He has a normal mood and affect. His behavior is normal.  Nursing note and vitals reviewed.   ED Course  Procedures (including critical care time) DIAGNOSTIC STUDIES: Oxygen Saturation is 100% on RA, normal by my interpretation.    COORDINATION OF CARE: 2:12 PM- Pt advised of plan for treatment and pt agrees. Pt will receive Naproxen and Robaxin. Advised to use Robaxin at night due to its side effects. Recommended to use Naproxen OTC after his prescription is finished. [facility] for follow up. Return precautions discussed, if pt has bowel/bladder incontinence.   MDM   Meds given in ED:  Medications - No data to display  New Prescriptions   METHOCARBAMOL (ROBAXIN) 500 MG TABLET    Take 1 tablet (500 mg total) by mouth 2 (two) times daily as needed for muscle spasms.   NAPROXEN (NAPROSYN) 500 MG TABLET    Take 1 tablet (500 mg total) by mouth 2 (two) times daily with a meal.    Final diagnoses:  Bilateral low back pain without sciatica   This is a 30 y.o. male who presents to the Emergency Department complaining of gradually worsening severe bilateral lower pain s/p twisting and bending while heavy lifting at work  onset 4 days ago. He explains that he was lifting something heavy at work as he was bending and twisting prior to onset. He adds that he has been having intermittent lower back pain for approx 2 years and he lifts heavy boxes at work all the time but his pain became significantly worse 4 days ago. He reports that the pain is worse with movement. He currently complains of one out of 10 back pain that is worse with movement. No gait problems. Patient with back pain.  No  neurological deficits and normal neuro exam.  Patient can walk but states is painful.  No loss of bowel or bladder control.  No concern for cauda equina.  No fever, night sweats, weight loss, h/o cancer, IVDU.  RICE protocol and pain medicine indicated and discussed with patient.  I advised the patient to follow-up with their primary care provider this week. I advised the patient to return to the emergency department with new or worsening symptoms or new concerns. The patient verbalized understanding and agreement with plan.    I personally performed the services described in this documentation, which was scribed in my presence. The recorded information has been reviewed and is accurate.      Everlene FarrierWilliam Kamdin Follett, PA-C 11/07/15 1433  Nelva Nayobert Beaton, MD 11/07/15 [phone]

## 2015-11-07 NOTE — Discharge Instructions (Signed)
Back Injury Prevention °Back injuries can be very painful. They can also be difficult to heal. After having one back injury, you are more likely to injure your back again. It is important to learn how to avoid injuring or re-injuring your back. The following tips can help you to prevent a back injury. °WHAT SHOULD I KNOW ABOUT PHYSICAL FITNESS? °· Exercise for 30 minutes per day on most days of the week or as directed by your health care provider. Make sure to: °· Do aerobic exercises, such as walking, jogging, biking, or swimming. °· Do exercises that increase balance and strength, such as tai chi and yoga. These can decrease your risk of falling and injuring your back. °· Do stretching exercises to help with flexibility. °· Try to develop strong abdominal muscles. Your abdominal muscles provide a lot of the support that is needed by your back. °· Maintain a healthy weight.  This helps to decrease your risk of a back injury. °WHAT SHOULD I KNOW ABOUT MY DIET? °· Talk with your health care provider about your overall diet. Take supplements and vitamins only as directed by your health care provider. °· Talk with your health care provider about how much calcium and vitamin D you need each day. These nutrients help to prevent weakening of the bones (osteoporosis). Osteoporosis can cause broken (fractured) bones, which lead to back pain. °· Include good sources of calcium in your diet, such as dairy products, green leafy vegetables, and products that have had calcium added to them (fortified). °· Include good sources of vitamin D in your diet, such as milk and foods that are fortified with vitamin D. °WHAT SHOULD I KNOW ABOUT MY POSTURE? °· Sit up straight and stand up straight. Avoid leaning forward when you sit or hunching over when you stand. °· Choose chairs that have good low-back (lumbar) support. °· If you work at a desk, sit close to it so you do not need to lean over. Keep your chin tucked in. Keep your neck  drawn back, and keep your elbows bent at a right angle. Your arms should look like the letter "L." °· Sit high and close to the steering wheel when you drive. Add a lumbar support to your car seat, if needed. °· Avoid sitting or standing in one position for very long. Take breaks to get up, stretch, and walk around at least one time every hour. Take breaks every hour if you are driving for long periods of time. °· Sleep on your side with your knees slightly bent, or sleep on your back with a pillow under your knees. Do not lie on the front of your body to sleep. °WHAT SHOULD I KNOW ABOUT LIFTING, TWISTING, AND REACHING? °Lifting and Heavy Lifting °· Avoid heavy lifting, especially repetitive heavy lifting. If you must do heavy lifting: °· Stretch before lifting. °· Work slowly. °· Rest between lifts. °· Use a tool such as a cart or a dolly to move objects if one is available. °· Make several small trips instead of carrying one heavy load. °· Ask for help when you need it, especially when moving big objects. °· Follow these steps when lifting: °· Stand with your feet shoulder-width apart. °· Get as close to the object as you can. Do not try to pick up a heavy object that is far from your body. °· Use handles or lifting straps if they are available. °· Bend at your knees. Squat down, but keep your heels off the floor. °·   Keep your shoulders pulled back, your chin tucked in, and your back straight.  Lift the object slowly while you tighten the muscles in your legs, abdomen, and buttocks. Keep the object as close to the center of your body as possible.  Follow these steps when putting down a heavy load:  Stand with your feet shoulder-width apart.  Lower the object slowly while you tighten the muscles in your legs, abdomen, and buttocks. Keep the object as close to the center of your body as possible.  Keep your shoulders pulled back, your chin tucked in, and your back straight.  Bend at your knees. Squat  down, but keep your heels off the floor.  Use handles or lifting straps if they are available. Twisting and Reaching 1. Avoid lifting heavy objects above your waist. 2. Do not twist at your waist while you are lifting or carrying a load. If you need to turn, move your feet. 3. Do not bend over without bending at your knees. 4. Avoid reaching over your head, across a table, or for an object on a high surface. WHAT ARE SOME OTHER TIPS? 1. Avoid wet floors and icy ground. Keep sidewalks clear of ice to prevent falls. 2. Do not sleep on a mattress that is too soft or too hard. 3. Keep items that are used frequently within easy reach. 4. Put heavier objects on shelves at waist level, and put lighter objects on lower or higher shelves. 5. Find ways to decrease your stress, such as exercise, massage, or relaxation techniques. Stress can build up in your muscles. Tense muscles are more vulnerable to injury. 6. Talk with your health care provider if you feel anxious or depressed. These conditions can make back pain worse. 7. Wear flat heel shoes with cushioned soles. 8. Avoid sudden movements. 9. Use both shoulder straps when carrying a backpack. 10. Do not use any tobacco products, including cigarettes, chewing tobacco, or electronic cigarettes. If you need help quitting, ask your health care provider.   This information is not intended to replace advice given to you by your health care provider. Make sure you discuss any questions you have with your health care provider.   Document Released: 06/10/2004 Document Revised: 09/17/2014 Document Reviewed: 05/07/2014 Elsevier Interactive Patient Education 2016 [employer]  Back Exercises The following exercises strengthen the muscles that help to support the back. They also help to keep the lower back flexible. Doing these exercises can help to prevent back pain or lessen existing pain. If you have back pain or discomfort, try doing these exercises  2-3 times each day or as told by your health care provider. When the pain goes away, do them once each day, but increase the number of times that you repeat the steps for each exercise (do more repetitions). If you do not have back pain or discomfort, do these exercises once each day or as told by your health care provider. EXERCISES Single Knee to Chest Repeat these steps 3-5 times for each leg:  Lie on your back on a firm bed or the floor with your legs extended.  Bring one knee to your chest. Your other leg should stay extended and in contact with the floor.  Hold your knee in place by grabbing your knee or thigh.  Pull on your knee until you feel a gentle stretch in your lower back.  Hold the stretch for 10-30 seconds.  Slowly release and straighten your leg. Pelvic Tilt Repeat these steps 5-10 times:  Lie on your back on a firm bed or the floor with your legs extended.  Bend your knees so they are pointing toward the ceiling and your feet are flat on the floor.  Tighten your lower abdominal muscles to press your lower back against the floor. This motion will tilt your pelvis so your tailbone points up toward the ceiling instead of pointing to your feet or the floor.  With gentle tension and even breathing, hold this position for 5-10 seconds. Cat-Cow Repeat these steps until your lower back becomes more flexible:  Get into a hands-and-knees position on a firm surface. Keep your hands under your shoulders, and keep your knees under your hips. You may place padding under your knees for comfort.  Let your head hang down, and point your tailbone toward the floor so your lower back becomes rounded like the back of a cat.  Hold this position for 5 seconds.  Slowly lift your head and point your tailbone up toward the ceiling so your back forms a sagging arch like the back of a cow.  Hold this position for 5 seconds. Press-Ups Repeat these steps 5-10 times:  Lie on your abdomen  (face-down) on the floor.  Place your palms near your head, about shoulder-width apart.  While you keep your back as relaxed as possible and keep your hips on the floor, slowly straighten your arms to raise the top half of your body and lift your shoulders. Do not use your back muscles to raise your upper torso. You may adjust the placement of your hands to make yourself more comfortable.  Hold this position for 5 seconds while you keep your back relaxed.  Slowly return to lying flat on the floor. Bridges Repeat these steps 10 times: 5. Lie on your back on a firm surface. 6. Bend your knees so they are pointing toward the ceiling and your feet are flat on the floor. 7. Tighten your buttocks muscles and lift your buttocks off of the floor until your waist is at almost the same height as your knees. You should feel the muscles working in your buttocks and the back of your thighs. If you do not feel these muscles, slide your feet 1-2 inches farther away from your buttocks. 8. Hold this position for 3-5 seconds. 9. Slowly lower your hips to the starting position, and allow your buttocks muscles to relax completely. If this exercise is too easy, try doing it with your arms crossed over your chest. Abdominal Crunches Repeat these steps 5-10 times: 11. Lie on your back on a firm bed or the floor with your legs extended. 12. Bend your knees so they are pointing toward the ceiling and your feet are flat on the floor. 15. Cross your arms over your chest. 14. Tip your chin slightly toward your chest without bending your neck. 24. Tighten your abdominal muscles and slowly raise your trunk (torso) high enough to lift your shoulder blades a tiny bit off of the floor. Avoid raising your torso higher than that, because it can put too much stress on your low back and it does not help to strengthen your abdominal muscles. 16. Slowly return to your starting position. Back Lifts Repeat these steps 5-10  times: 1. Lie on your abdomen (face-down) with your arms at your sides, and rest your forehead on the floor. 2. Tighten the muscles in your legs and your buttocks. 3. Slowly lift your chest off of the floor while you keep your hips  pressed to the floor. Keep the back of your head in line with the curve in your back. Your eyes should be looking at the floor. 4. Hold this position for 3-5 seconds. 5. Slowly return to your starting position. SEEK MEDICAL CARE IF:  Your back pain or discomfort gets much worse when you do an exercise.  Your back pain or discomfort does not lessen within 2 hours after you exercise. If you have any of these problems, stop doing these exercises right away. Do not do them again unless your health care provider says that you can. SEEK IMMEDIATE MEDICAL CARE IF:  You develop sudden, severe back pain. If this happens, stop doing the exercises right away. Do not do them again unless your health care provider says that you can.   This information is not intended to replace advice given to you by your health care provider. Make sure you discuss any questions you have with your health care provider.   Document Released: 06/10/2004 Document Revised: 01/22/2015 Document Reviewed: 06/27/2014 Elsevier Interactive Patient Education 2016 [employer]  Back Pain, Adult Back pain is very common in adults.The cause of back pain is rarely dangerous and the pain often gets better over time.The cause of your back pain may not be known. Some common causes of back pain include:  Strain of the muscles or ligaments supporting the spine.  Wear and tear (degeneration) of the spinal disks.  Arthritis.  Direct injury to the back. For many people, back pain may return. Since back pain is rarely dangerous, most people can learn to manage this condition on their own. HOME CARE INSTRUCTIONS Watch your back pain for any changes. The following actions may help to lessen any discomfort  you are feeling:  Remain active. It is stressful on your back to sit or stand in one place for long periods of time. Do not sit, drive, or stand in one place for more than 30 minutes at a time. Take short walks on even surfaces as soon as you are able.Try to increase the length of time you walk each day.  Exercise regularly as directed by your health care provider. Exercise helps your back heal faster. It also helps avoid future injury by keeping your muscles strong and flexible.  Do not stay in bed.Resting more than 1-2 days can delay your recovery.  Pay attention to your body when you bend and lift. The most comfortable positions are those that put less stress on your recovering back. Always use proper lifting techniques, including:  Bending your knees.  Keeping the load close to your body.  Avoiding twisting.  Find a comfortable position to sleep. Use a firm mattress and lie on your side with your knees slightly bent. If you lie on your back, put a pillow under your knees.  Avoid feeling anxious or stressed.Stress increases muscle tension and can worsen back pain.It is important to recognize when you are anxious or stressed and learn ways to manage it, such as with exercise.  Take medicines only as directed by your health care provider. Over-the-counter medicines to reduce pain and inflammation are often the most helpful.Your health care provider may prescribe muscle relaxant drugs.These medicines help dull your pain so you can more quickly return to your normal activities and healthy exercise.  Apply ice to the injured area:  Put ice in a plastic bag.  Place a towel between your skin and the bag.  Leave the ice on for 20 minutes, 2-3 times a  day for the first 2-3 days. After that, ice and heat may be alternated to reduce pain and spasms.  Maintain a healthy weight. Excess weight puts extra stress on your back and makes it difficult to maintain good posture. SEEK MEDICAL CARE  IF:  You have pain that is not relieved with rest or medicine.  You have increasing pain going down into the legs or buttocks.  You have pain that does not improve in one week.  You have night pain.  You lose weight.  You have a fever or chills. SEEK IMMEDIATE MEDICAL CARE IF:   You develop new bowel or bladder control problems.  You have unusual weakness or numbness in your arms or legs.  You develop nausea or vomiting.  You develop abdominal pain.  You feel faint.   This information is not intended to replace advice given to you by your health care provider. Make sure you discuss any questions you have with your health care provider.   Document Released: 05/03/2005 Document Revised: 05/24/2014 Document Reviewed: 09/04/2013 Elsevier Interactive Patient Education [employer].

## 2015-11-07 NOTE — ED Notes (Signed)
Pt c/o severe lower back pain x 2years. Denies injury and urinary sx. Pt states he believes he strained it at work.

## 2015-11-07 NOTE — ED Notes (Signed)
Discharge instructions, follow up care, and prescritpions reviewed with patient. Patient verbalized understanding.

## 2015-11-07 NOTE — ED Notes (Signed)
Pt told registration he was leaving and turned in his sticker sheet

## 2016-07-07 ENCOUNTER — Encounter (HOSPITAL_COMMUNITY): Payer: Self-pay

## 2016-07-07 DIAGNOSIS — F17210 Nicotine dependence, cigarettes, uncomplicated: Secondary | ICD-10-CM | POA: Insufficient documentation

## 2016-07-07 DIAGNOSIS — L723 Sebaceous cyst: Secondary | ICD-10-CM | POA: Insufficient documentation

## 2016-07-07 NOTE — ED Triage Notes (Signed)
Pt endorses recurrent abscess to left jaw area, denies dental pain. Abscess is exterior on face. VSS. Denies fever or chills. Not open and no drainage.

## 2016-07-08 ENCOUNTER — Emergency Department (HOSPITAL_COMMUNITY)
Admission: EM | Admit: 2016-07-08 | Discharge: 2016-07-08 | Disposition: A | Discharge status: Home / Self Care | Payer: Self-pay | Attending: Emergency Medicine | Admitting: Emergency Medicine

## 2016-07-08 DIAGNOSIS — L089 Local infection of the skin and subcutaneous tissue, unspecified: Secondary | ICD-10-CM

## 2016-07-08 DIAGNOSIS — L729 Follicular cyst of the skin and subcutaneous tissue, unspecified: Secondary | ICD-10-CM

## 2016-07-08 MED ORDER — CEPHALEXIN 500 MG PO CAPS: 500.0000 mg | ORAL_CAPSULE | Freq: Four times a day (QID) | ORAL | 0 refills | Status: DC

## 2016-07-08 MED ORDER — SULFAMETHOXAZOLE-TRIMETHOPRIM 800-160 MG PO TABS
1.0000 | ORAL_TABLET | Freq: Once | ORAL | Status: AC
  Administered 2016-07-08: 1 via ORAL
  Filled 2016-07-08: qty 1

## 2016-07-08 MED ORDER — CEPHALEXIN 250 MG PO CAPS
500.0000 mg | ORAL_CAPSULE | Freq: Once | ORAL | Status: AC
  Administered 2016-07-08: 500 mg via ORAL
  Filled 2016-07-08: qty 2

## 2016-07-08 MED ORDER — SULFAMETHOXAZOLE-TRIMETHOPRIM 800-160 MG PO TABS: 1.0000 | ORAL_TABLET | Freq: Two times a day (BID) | ORAL | 0 refills | Status: AC

## 2016-07-08 NOTE — Discharge Instructions (Signed)
Take the medication as directed, apply warm wet compresses to the area. Follow up with your doctor or return here for worsening symptoms.

## 2016-07-08 NOTE — ED Provider Notes (Signed)
[facility]-EMERGENCY DEPT Provider Note   CSN: [id number] Arrival date & time: 07/07/16  2319     History   Chief Complaint Chief Complaint  Patient presents with  . Abscess    HPI Antonio Wright is a 31 y.o. male who presents to the ED with swelling and discomfort to the left side of his face that started a few days ago and has gotten worse. Patient reports that he gets infected hair follicles and then it forms an abscess and usually drains. He has had to have one drained before. He reports that he would just like antibiotics tonight and he will use the compresses.   HPI  Past Medical History:  Diagnosis Date  . Chronic back pain     Patient Active Problem List   Diagnosis Date Noted  . Substance induced mood disorder (HCC) 12/03/2014  . 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 No pertinent surgical history.     Home Medications    Prior to Admission medications   Medication Sig Start Date End Date Taking? Authorizing Provider  cephALEXin (KEFLEX) 500 MG capsule Take 1 capsule (500 mg total) by mouth 4 (four) times daily. 07/08/16   Andrick Rust Orlene Och, NP  sulfamethoxazole-trimethoprim (BACTRIM DS,SEPTRA DS) 800-160 MG tablet Take 1 tablet by mouth 2 (two) times daily. 07/08/16 07/15/16  Lovelle Lema Orlene Och, NP    Family History Family History  Problem Relation Age of Onset  . Diabetes Father   . Diabetes Other     Social History Social History  Substance Use Topics  . Smoking status: Current Every Day Smoker    Packs/day: 0.50    Types: Cigarettes  . Smokeless tobacco: Not on file  . Alcohol use Yes     Comment: occassionally     Allergies   Patient has no known allergies.   Review of Systems Review of Systems  Constitutional: Negative for chills and fever.  HENT: Positive for facial swelling. Negative for congestion, sore throat and trouble swallowing.   Gastrointestinal: Negative for nausea.  Musculoskeletal: Negative for neck pain.  Skin: Positive for wound.    Psychiatric/Behavioral: Negative for confusion.     Physical Exam Updated Vital Signs BP 130/59 (BP Location: Right Arm)   Pulse (!) 57   Temp 98.1 F (36.7 C) (Oral)   Resp 18   Ht 6' (1.829 m)   Wt 73.9 kg   SpO2 100%   BMI 22.11 kg/m   Physical Exam  Constitutional: He is oriented to person, place, and time. He appears well-developed and well-nourished. No distress.  HENT:  Raised firm area to the left side of the face near the jaw line. Slightly tender on palpation.   Eyes: EOM are normal.  Neck: Neck supple.  Pulmonary/Chest: Effort normal.  Abdominal: Soft. There is no tenderness.  Musculoskeletal: Normal range of motion.  Neurological: He is alert and oriented to person, place, and time. No cranial nerve deficit.  Skin: Skin is warm and dry.  Psychiatric: He has a normal mood and affect.  Nursing note and vitals reviewed.    ED Treatments / Results  Labs (all labs ordered are listed, but only abnormal results are displayed) Labs Reviewed - No data to display  Radiology No results found.  Procedures Procedures (including critical care time)  Medications Ordered in ED Medications  sulfamethoxazole-trimethoprim (BACTRIM DS,SEPTRA DS) 800-160 MG per tablet 1 tablet (1 tablet Oral Given 07/08/16 0144)  cephALEXin (KEFLEX) capsule 500 mg (500 mg Oral Given 07/08/16  0144)     Initial Impression / Assessment and Plan / ED Course  I have reviewed the triage vital signs and the nursing notes.  Discussed with the patient I&D but he states that he has to work tomorrow and would not be able to if he had an open wound even if it was covered. He request antibiotics and he will apply compresses to the area. He will return if the symptoms worsen. Stable for d/c without fever, red streaking and appears safe for d/c   Final Clinical Impressions(s) / ED Diagnoses   Final diagnoses:  Infected cyst of skin    New Prescriptions New Prescriptions   CEPHALEXIN (KEFLEX)  500 MG CAPSULE    Take 1 capsule (500 mg total) by mouth 4 (four) times daily.   SULFAMETHOXAZOLE-TRIMETHOPRIM (BACTRIM DS,SEPTRA DS) 800-160 MG TABLET    Take 1 tablet by mouth 2 (two) times daily.     [address]oriaM Rolin Schult, NP 07/08/16 0210    Gilda Creasehristopher J Pollina, MD 07/08/16 [id number]

## 2016-07-08 NOTE — ED Notes (Signed)
ED Provider at bedside. 

## 2017-02-21 ENCOUNTER — Emergency Department (HOSPITAL_COMMUNITY): Payer: Self-pay

## 2017-02-21 ENCOUNTER — Emergency Department (HOSPITAL_COMMUNITY): Payer: Self-pay | Admitting: Certified Registered Nurse Anesthetist

## 2017-02-21 ENCOUNTER — Encounter (HOSPITAL_COMMUNITY): Payer: Self-pay

## 2017-02-21 ENCOUNTER — Encounter (HOSPITAL_COMMUNITY)
Admission: EM | Discharge status: Against Medical Advice | Payer: Self-pay | Source: Home / Self Care | Attending: Emergency Medicine

## 2017-02-21 ENCOUNTER — Ambulatory Visit (HOSPITAL_COMMUNITY)
Admission: EM | Admit: 2017-02-21 | Discharge: 2017-02-21 | Discharge status: Against Medical Advice | Payer: Self-pay | Attending: Orthopedic Surgery | Admitting: Orthopedic Surgery

## 2017-02-21 ENCOUNTER — Inpatient Hospital Stay: Admit: 2017-02-21 | Payer: Self-pay | Admitting: Orthopedic Surgery

## 2017-02-21 DIAGNOSIS — S66821A Laceration of other specified muscles, fascia and tendons at wrist and hand level, right hand, initial encounter: Secondary | ICD-10-CM | POA: Insufficient documentation

## 2017-02-21 DIAGNOSIS — X780XXA Intentional self-harm by sharp glass, initial encounter: Secondary | ICD-10-CM | POA: Insufficient documentation

## 2017-02-21 DIAGNOSIS — Z23 Encounter for immunization: Secondary | ICD-10-CM | POA: Insufficient documentation

## 2017-02-21 DIAGNOSIS — F1590 Other stimulant use, unspecified, uncomplicated: Secondary | ICD-10-CM | POA: Insufficient documentation

## 2017-02-21 DIAGNOSIS — F1290 Cannabis use, unspecified, uncomplicated: Secondary | ICD-10-CM | POA: Insufficient documentation

## 2017-02-21 DIAGNOSIS — Y9389 Activity, other specified: Secondary | ICD-10-CM | POA: Insufficient documentation

## 2017-02-21 DIAGNOSIS — Z5321 Procedure and treatment not carried out due to patient leaving prior to being seen by health care provider: Secondary | ICD-10-CM | POA: Insufficient documentation

## 2017-02-21 DIAGNOSIS — F17210 Nicotine dependence, cigarettes, uncomplicated: Secondary | ICD-10-CM | POA: Insufficient documentation

## 2017-02-21 DIAGNOSIS — S51811A Laceration without foreign body of right forearm, initial encounter: Secondary | ICD-10-CM | POA: Diagnosis present

## 2017-02-21 DIAGNOSIS — S0181XA Laceration without foreign body of other part of head, initial encounter: Secondary | ICD-10-CM

## 2017-02-21 DIAGNOSIS — T148XXA Other injury of unspecified body region, initial encounter: Secondary | ICD-10-CM

## 2017-02-21 DIAGNOSIS — R000 Tachycardia, unspecified: Secondary | ICD-10-CM | POA: Insufficient documentation

## 2017-02-21 HISTORY — PX: I & D EXTREMITY: SHX5045

## 2017-02-21 LAB — I-STAT CHEM 8, ED
BUN: 8 mg/dL (ref 6–20)
Calcium, Ion: 1.1 mmol/L — ABNORMAL LOW (ref 1.15–1.40)
Chloride: 108 mmol/L (ref 101–111)
Creatinine, Ser: 1.2 mg/dL (ref 0.61–1.24)
Glucose, Bld: 118 mg/dL — ABNORMAL HIGH (ref 65–99)
HCT: 39 % (ref 39.0–52.0)
Hemoglobin: 13.3 g/dL (ref 13.0–17.0)
Potassium: 3.5 mmol/L (ref 3.5–5.1)
Sodium: 142 mmol/L (ref 135–145)
TCO2: 23 mmol/L (ref 22–32)

## 2017-02-21 LAB — CBC WITH DIFFERENTIAL/PLATELET
Basophils Absolute: 0 10*3/uL (ref 0.0–0.1)
Basophils Relative: 1 %
Eosinophils Absolute: 0.1 10*3/uL (ref 0.0–0.7)
Eosinophils Relative: 2 %
HCT: 38.8 % — ABNORMAL LOW (ref 39.0–52.0)
Hemoglobin: 13.3 g/dL (ref 13.0–17.0)
LYMPHS ABS: 2.6 10*3/uL (ref 0.7–4.0)
Lymphocytes Relative: 46 %
MCH: 30 pg (ref 26.0–34.0)
MCHC: 34.3 g/dL (ref 30.0–36.0)
MCV: 87.4 fL (ref 78.0–100.0)
MONO ABS: 0.5 10*3/uL (ref 0.1–1.0)
MONOS PCT: 8 %
NEUTROS PCT: 43 %
Neutro Abs: 2.5 10*3/uL (ref 1.7–7.7)
PLATELETS: 192 10*3/uL (ref 150–400)
RBC: 4.44 MIL/uL (ref 4.22–5.81)
RDW: 12.1 % (ref 11.5–15.5)
WBC: 5.7 10*3/uL (ref 4.0–10.5)

## 2017-02-21 LAB — PREPARE RBC (CROSSMATCH)

## 2017-02-21 LAB — HEMOGLOBIN AND HEMATOCRIT, BLOOD
HEMATOCRIT: 25.4 % — AB (ref 39.0–52.0)
Hemoglobin: 8.5 g/dL — ABNORMAL LOW (ref 13.0–17.0)

## 2017-02-21 LAB — ABO/RH: ABO/RH(D): O POS

## 2017-02-21 SURGERY — IRRIGATION AND DEBRIDEMENT EXTREMITY
Anesthesia: General | Site: Arm Lower | Laterality: Right

## 2017-02-21 MED ORDER — ALPRAZOLAM 0.5 MG PO TABS: 0.5000 mg | ORAL_TABLET | Freq: Four times a day (QID) | ORAL | Status: DC | PRN

## 2017-02-21 MED ORDER — CEFAZOLIN SODIUM-DEXTROSE 1-4 GM/50ML-% IV SOLN
INTRAVENOUS | Status: DC | PRN
  Administered 2017-02-21: 2 g via INTRAVENOUS

## 2017-02-21 MED ORDER — HYDROMORPHONE HCL 1 MG/ML IJ SOLN: 0.5000 mg | INTRAMUSCULAR | Status: DC | PRN

## 2017-02-21 MED ORDER — SODIUM CHLORIDE 0.9 % IV SOLN: 10.0000 mL/h | Freq: Once | INTRAVENOUS | Status: DC

## 2017-02-21 MED ORDER — LIDOCAINE 2% (20 MG/ML) 5 ML SYRINGE
INTRAMUSCULAR | Status: AC
  Filled 2017-02-21: qty 5

## 2017-02-21 MED ORDER — PHENYLEPHRINE 40 MCG/ML (10ML) SYRINGE FOR IV PUSH (FOR BLOOD PRESSURE SUPPORT)
PREFILLED_SYRINGE | INTRAVENOUS | Status: AC
  Filled 2017-02-21: qty 10

## 2017-02-21 MED ORDER — OXYCODONE-ACETAMINOPHEN 5-325 MG PO TABS: 1.0000 | ORAL_TABLET | Freq: Three times a day (TID) | ORAL | 0 refills | Status: AC

## 2017-02-21 MED ORDER — DEXMEDETOMIDINE HCL 200 MCG/2ML IV SOLN
INTRAVENOUS | Status: DC | PRN
  Administered 2017-02-21: 40 ug via INTRAVENOUS

## 2017-02-21 MED ORDER — 0.9 % SODIUM CHLORIDE (POUR BTL) OPTIME
TOPICAL | Status: DC | PRN
  Administered 2017-02-21: 1000 mL

## 2017-02-21 MED ORDER — METHOCARBAMOL 500 MG PO TABS: 500.0000 mg | ORAL_TABLET | Freq: Three times a day (TID) | ORAL | 0 refills | Status: DC

## 2017-02-21 MED ORDER — PHENYLEPHRINE HCL 10 MG/ML IJ SOLN
INTRAMUSCULAR | Status: DC | PRN
  Administered 2017-02-21: 100 ug/min via INTRAVENOUS

## 2017-02-21 MED ORDER — ALBUMIN HUMAN 5 % IV SOLN
INTRAVENOUS | Status: DC | PRN
  Administered 2017-02-21 (×4): via INTRAVENOUS

## 2017-02-21 MED ORDER — CEFAZOLIN SODIUM-DEXTROSE 1-4 GM/50ML-% IV SOLN
1.0000 g | INTRAVENOUS | Status: DC
  Filled 2017-02-21: qty 50

## 2017-02-21 MED ORDER — HYDROMORPHONE HCL 1 MG/ML IJ SOLN
1.0000 mg | Freq: Once | INTRAMUSCULAR | Status: AC
  Administered 2017-02-21: 1 mg via INTRAVENOUS
  Filled 2017-02-21: qty 1

## 2017-02-21 MED ORDER — FENTANYL CITRATE (PF) 100 MCG/2ML IJ SOLN: 50.0000 ug | Freq: Once | INTRAMUSCULAR | Status: DC

## 2017-02-21 MED ORDER — SUGAMMADEX SODIUM 200 MG/2ML IV SOLN
INTRAVENOUS | Status: AC
  Filled 2017-02-21: qty 8

## 2017-02-21 MED ORDER — PROPOFOL 10 MG/ML IV BOLUS
INTRAVENOUS | Status: AC
Start: 2017-02-21 — End: 2017-02-21
  Filled 2017-02-21: qty 20

## 2017-02-21 MED ORDER — DEXMEDETOMIDINE HCL IN NACL 200 MCG/50ML IV SOLN
INTRAVENOUS | Status: AC
  Filled 2017-02-21: qty 50

## 2017-02-21 MED ORDER — DEXAMETHASONE SODIUM PHOSPHATE 10 MG/ML IJ SOLN
INTRAMUSCULAR | Status: DC | PRN
  Administered 2017-02-21: 10 mg via INTRAVENOUS

## 2017-02-21 MED ORDER — DEXAMETHASONE SODIUM PHOSPHATE 10 MG/ML IJ SOLN
INTRAMUSCULAR | Status: AC
  Filled 2017-02-21: qty 1

## 2017-02-21 MED ORDER — LACTATED RINGERS IV SOLN
INTRAVENOUS | Status: DC | PRN
  Administered 2017-02-21 (×3): via INTRAVENOUS

## 2017-02-21 MED ORDER — KCL IN DEXTROSE-NACL 20-5-0.45 MEQ/L-%-% IV SOLN
INTRAVENOUS | Status: DC
Start: 2017-02-21 — End: 2017-02-21

## 2017-02-21 MED ORDER — PHENYLEPHRINE 40 MCG/ML (10ML) SYRINGE FOR IV PUSH (FOR BLOOD PRESSURE SUPPORT)
PREFILLED_SYRINGE | INTRAVENOUS | Status: DC | PRN
  Administered 2017-02-21 (×3): 120 ug via INTRAVENOUS
  Administered 2017-02-21: 80 ug via INTRAVENOUS
  Administered 2017-02-21: 160 ug via INTRAVENOUS

## 2017-02-21 MED ORDER — CEFAZOLIN SODIUM-DEXTROSE 1-4 GM/50ML-% IV SOLN
1.0000 g | Freq: Three times a day (TID) | INTRAVENOUS | Status: DC
  Filled 2017-02-21: qty 50

## 2017-02-21 MED ORDER — OXYCODONE HCL 5 MG/5ML PO SOLN: 5.0000 mg | Freq: Once | ORAL | Status: AC | PRN

## 2017-02-21 MED ORDER — HYDROMORPHONE HCL 1 MG/ML IJ SOLN: 0.2500 mg | INTRAMUSCULAR | Status: DC | PRN

## 2017-02-21 MED ORDER — VITAMIN C 500 MG PO TABS: 1000.0000 mg | ORAL_TABLET | Freq: Every day | ORAL | Status: DC

## 2017-02-21 MED ORDER — METHOCARBAMOL 500 MG PO TABS: 500.0000 mg | ORAL_TABLET | Freq: Four times a day (QID) | ORAL | Status: DC | PRN

## 2017-02-21 MED ORDER — MIDAZOLAM HCL 2 MG/2ML IJ SOLN
INTRAMUSCULAR | Status: AC
  Filled 2017-02-21: qty 2

## 2017-02-21 MED ORDER — PROPOFOL 10 MG/ML IV BOLUS
INTRAVENOUS | Status: DC | PRN
  Administered 2017-02-21: 160 mg via INTRAVENOUS

## 2017-02-21 MED ORDER — TETANUS-DIPHTH-ACELL PERTUSSIS 5-2.5-18.5 LF-MCG/0.5 IM SUSP: 0.5000 mL | Freq: Once | INTRAMUSCULAR | Status: DC

## 2017-02-21 MED ORDER — FENTANYL CITRATE (PF) 250 MCG/5ML IJ SOLN
INTRAMUSCULAR | Status: DC | PRN
  Administered 2017-02-21: 50 ug via INTRAVENOUS
  Administered 2017-02-21: 100 ug via INTRAVENOUS
  Administered 2017-02-21: 50 ug via INTRAVENOUS

## 2017-02-21 MED ORDER — SUCCINYLCHOLINE CHLORIDE 200 MG/10ML IV SOSY
PREFILLED_SYRINGE | INTRAVENOUS | Status: DC | PRN
  Administered 2017-02-21: 160 mg via INTRAVENOUS

## 2017-02-21 MED ORDER — FENTANYL CITRATE (PF) 100 MCG/2ML IJ SOLN
50.0000 ug | Freq: Once | INTRAMUSCULAR | Status: AC
  Administered 2017-02-21: 50 ug via INTRAVENOUS

## 2017-02-21 MED ORDER — LIDOCAINE 2% (20 MG/ML) 5 ML SYRINGE
INTRAMUSCULAR | Status: DC | PRN
  Administered 2017-02-21: 60 mg via INTRAVENOUS

## 2017-02-21 MED ORDER — ONDANSETRON HCL 4 MG/2ML IJ SOLN
INTRAMUSCULAR | Status: AC
  Filled 2017-02-21: qty 2

## 2017-02-21 MED ORDER — OXYCODONE HCL 5 MG PO TABS
5.0000 mg | ORAL_TABLET | Freq: Once | ORAL | Status: AC | PRN
  Administered 2017-02-21: 5 mg via ORAL

## 2017-02-21 MED ORDER — ONDANSETRON HCL 4 MG/2ML IJ SOLN: 4.0000 mg | Freq: Once | INTRAMUSCULAR | Status: DC | PRN

## 2017-02-21 MED ORDER — OXYCODONE HCL 5 MG PO TABS
ORAL_TABLET | ORAL | Status: AC
  Filled 2017-02-21: qty 1

## 2017-02-21 MED ORDER — DIPHENHYDRAMINE HCL 25 MG PO CAPS: 25.0000 mg | ORAL_CAPSULE | Freq: Four times a day (QID) | ORAL | Status: DC | PRN

## 2017-02-21 MED ORDER — OXYCODONE HCL 5 MG PO TABS: 5.0000 mg | ORAL_TABLET | ORAL | Status: DC | PRN

## 2017-02-21 MED ORDER — ONDANSETRON HCL 4 MG PO TABS: 4.0000 mg | ORAL_TABLET | Freq: Four times a day (QID) | ORAL | Status: DC | PRN

## 2017-02-21 MED ORDER — DOCUSATE SODIUM 100 MG PO CAPS: 100.0000 mg | ORAL_CAPSULE | Freq: Two times a day (BID) | ORAL | 0 refills | Status: DC

## 2017-02-21 MED ORDER — ONDANSETRON HCL 4 MG/2ML IJ SOLN
INTRAMUSCULAR | Status: DC | PRN
  Administered 2017-02-21: 4 mg via INTRAVENOUS

## 2017-02-21 MED ORDER — NALOXONE HCL 0.4 MG/ML IJ SOLN
INTRAMUSCULAR | Status: AC
  Filled 2017-02-21: qty 1

## 2017-02-21 MED ORDER — ROCURONIUM BROMIDE 10 MG/ML (PF) SYRINGE
PREFILLED_SYRINGE | INTRAVENOUS | Status: AC
  Filled 2017-02-21: qty 10

## 2017-02-21 MED ORDER — MIDAZOLAM HCL 2 MG/2ML IJ SOLN
INTRAMUSCULAR | Status: DC | PRN
  Administered 2017-02-21: 2 mg via INTRAVENOUS

## 2017-02-21 MED ORDER — ASPIRIN EC 325 MG PO TBEC: 325.0000 mg | DELAYED_RELEASE_TABLET | Freq: Every day | ORAL | 0 refills | Status: DC

## 2017-02-21 MED ORDER — SODIUM CHLORIDE 0.9 % IV SOLN
INTRAVENOUS | Status: DC | PRN
  Administered 2017-02-21: 17:00:00 500 mL

## 2017-02-21 MED ORDER — HYDROCODONE-ACETAMINOPHEN 10-325 MG PO TABS: 1.0000 | ORAL_TABLET | ORAL | Status: DC | PRN

## 2017-02-21 MED ORDER — FENTANYL CITRATE (PF) 100 MCG/2ML IJ SOLN
INTRAMUSCULAR | Status: DC
Start: 2017-02-21 — End: 2017-02-21
  Filled 2017-02-21: qty 2

## 2017-02-21 MED ORDER — FENTANYL CITRATE (PF) 250 MCG/5ML IJ SOLN
INTRAMUSCULAR | Status: AC
  Filled 2017-02-21: qty 5

## 2017-02-21 MED ORDER — ADULT MULTIVITAMIN W/MINERALS CH: 1.0000 | ORAL_TABLET | Freq: Every day | ORAL | Status: DC

## 2017-02-21 MED ORDER — ONDANSETRON HCL 4 MG/2ML IJ SOLN: 4.0000 mg | Freq: Four times a day (QID) | INTRAMUSCULAR | Status: DC | PRN

## 2017-02-21 MED ORDER — DEXTROSE 5 % IV SOLN: 500.0000 mg | Freq: Four times a day (QID) | INTRAVENOUS | Status: DC | PRN

## 2017-02-21 MED ORDER — GLYCOPYRROLATE 0.2 MG/ML IV SOSY
PREFILLED_SYRINGE | INTRAVENOUS | Status: DC | PRN
  Administered 2017-02-21 (×2): .2 mg via INTRAVENOUS

## 2017-02-21 MED ORDER — TETANUS-DIPHTH-ACELL PERTUSSIS 5-2.5-18.5 LF-MCG/0.5 IM SUSP
0.5000 mL | Freq: Once | INTRAMUSCULAR | Status: AC
  Administered 2017-02-21: 0.5 mL via INTRAMUSCULAR
  Filled 2017-02-21: qty 0.5

## 2017-02-21 MED ORDER — CEFAZOLIN SODIUM 1 G IJ SOLR
INTRAMUSCULAR | Status: AC
  Filled 2017-02-21: qty 20

## 2017-02-21 MED ORDER — LACTATED RINGERS IV SOLN
INTRAVENOUS | Status: DC | PRN
  Administered 2017-02-21: 16:00:00 via INTRAVENOUS

## 2017-02-21 SURGICAL SUPPLY — 71 items
BANDAGE ACE 3X5.8 VEL STRL LF (GAUZE/BANDAGES/DRESSINGS) ×3 IMPLANT
BANDAGE ACE 4X5 VEL STRL LF (GAUZE/BANDAGES/DRESSINGS) ×3 IMPLANT
BNDG CMPR 9X4 STRL LF SNTH (GAUZE/BANDAGES/DRESSINGS) ×2
BNDG COHESIVE 1X5 TAN STRL LF (GAUZE/BANDAGES/DRESSINGS) IMPLANT
BNDG CONFORM 2 STRL LF (GAUZE/BANDAGES/DRESSINGS) IMPLANT
BNDG ESMARK 4X9 LF (GAUZE/BANDAGES/DRESSINGS) ×5 IMPLANT
BNDG GAUZE ELAST 4 BULKY (GAUZE/BANDAGES/DRESSINGS) ×3 IMPLANT
CORD BIPOLAR FORCEPS 12FT (ELECTRODE) ×2 IMPLANT
CORDS BIPOLAR (ELECTRODE) ×3 IMPLANT
COVER SURGICAL LIGHT HANDLE (MISCELLANEOUS) ×3 IMPLANT
CUFF TOURNIQUET SINGLE 18IN (TOURNIQUET CUFF) ×3 IMPLANT
CUFF TOURNIQUET SINGLE 24IN (TOURNIQUET CUFF) IMPLANT
DRAIN PENROSE 1/4X12 LTX STRL (WOUND CARE) IMPLANT
DRAPE SURG 17X23 STRL (DRAPES) ×3 IMPLANT
DRSG ADAPTIC 3X8 NADH LF (GAUZE/BANDAGES/DRESSINGS) ×3 IMPLANT
ELECT REM PT RETURN 9FT ADLT (ELECTROSURGICAL)
ELECTRODE REM PT RTRN 9FT ADLT (ELECTROSURGICAL) IMPLANT
GAUZE SPONGE 4X4 12PLY STRL (GAUZE/BANDAGES/DRESSINGS) ×3 IMPLANT
GAUZE XEROFORM 1X8 LF (GAUZE/BANDAGES/DRESSINGS) ×3 IMPLANT
GAUZE XEROFORM 5X9 LF (GAUZE/BANDAGES/DRESSINGS) IMPLANT
GLOVE BIOGEL PI IND STRL 8.5 (GLOVE) ×1 IMPLANT
GLOVE BIOGEL PI INDICATOR 8.5 (GLOVE) ×2
GLOVE SURG ORTHO 8.0 STRL STRW (GLOVE) ×3 IMPLANT
GOWN STRL REUS W/ TWL LRG LVL3 (GOWN DISPOSABLE) ×3 IMPLANT
GOWN STRL REUS W/ TWL XL LVL3 (GOWN DISPOSABLE) ×1 IMPLANT
GOWN STRL REUS W/TWL LRG LVL3 (GOWN DISPOSABLE) ×9
GOWN STRL REUS W/TWL XL LVL3 (GOWN DISPOSABLE) ×3
HANDPIECE INTERPULSE COAX TIP (DISPOSABLE)
KIT BASIN OR (CUSTOM PROCEDURE TRAY) ×3 IMPLANT
KIT ROOM TURNOVER OR (KITS) ×3 IMPLANT
MANIFOLD NEPTUNE II (INSTRUMENTS) ×3 IMPLANT
NDL HYPO 25GX1X1/2 BEV (NEEDLE) IMPLANT
NEEDLE HYPO 25GX1X1/2 BEV (NEEDLE) IMPLANT
NS IRRIG 1000ML POUR BTL (IV SOLUTION) ×3 IMPLANT
PACK ORTHO EXTREMITY (CUSTOM PROCEDURE TRAY) ×3 IMPLANT
PAD ABD 8X10 STRL (GAUZE/BANDAGES/DRESSINGS) ×2 IMPLANT
PAD ARMBOARD 7.5X6 YLW CONV (MISCELLANEOUS) ×6 IMPLANT
PAD CAST 3X4 CTTN HI CHSV (CAST SUPPLIES) IMPLANT
PAD CAST 4YDX4 CTTN HI CHSV (CAST SUPPLIES) ×1 IMPLANT
PADDING CAST COTTON 3X4 STRL (CAST SUPPLIES) ×3
PADDING CAST COTTON 4X4 STRL (CAST SUPPLIES) ×3
SET CYSTO W/LG BORE CLAMP LF (SET/KITS/TRAYS/PACK) IMPLANT
SET HNDPC FAN SPRY TIP SCT (DISPOSABLE) IMPLANT
SOAP 2 % CHG 4 OZ (WOUND CARE) ×3 IMPLANT
SPLINT FIBERGLASS 3X35 (CAST SUPPLIES) ×2 IMPLANT
SPONGE LAP 18X18 X RAY DECT (DISPOSABLE) ×3 IMPLANT
SPONGE LAP 4X18 X RAY DECT (DISPOSABLE) ×3 IMPLANT
SUCTION FRAZIER HANDLE 10FR (MISCELLANEOUS) ×2
SUCTION TUBE FRAZIER 10FR DISP (MISCELLANEOUS) ×1 IMPLANT
SUT ETHILON 4 0 PS 2 18 (SUTURE) ×4 IMPLANT
SUT ETHILON 5 0 P 3 18 (SUTURE)
SUT ETHILON 8 0 BV130 4 (SUTURE) ×2 IMPLANT
SUT FIBERWIRE 3-0 18 DIAM 3/8 (SUTURE) ×9
SUT FIBERWIRE 3-0 18 TAPR NDL (SUTURE) ×3
SUT NYLON ETHILON 5-0 P-3 1X18 (SUTURE) IMPLANT
SUT PROLENE 3 0 PS 2 (SUTURE) ×6 IMPLANT
SUT PROLENE 7 0 P 1 (SUTURE) ×4 IMPLANT
SUT VIC AB 2-0 CT1 27 (SUTURE) ×6
SUT VIC AB 2-0 CT1 TAPERPNT 27 (SUTURE) IMPLANT
SUTURE FIBERWR 3-0 18 DIAM 3/8 (SUTURE) IMPLANT
SUTURE FIBERWR 3-0 18 TAPR NDL (SUTURE) IMPLANT
SWAB COLLECTION DEVICE MRSA (MISCELLANEOUS) ×3 IMPLANT
SWAB CULTURE ESWAB REG 1ML (MISCELLANEOUS) IMPLANT
SYR CONTROL 10ML LL (SYRINGE) IMPLANT
TOWEL OR 17X24 6PK STRL BLUE (TOWEL DISPOSABLE) ×3 IMPLANT
TOWEL OR 17X26 10 PK STRL BLUE (TOWEL DISPOSABLE) ×3 IMPLANT
TUBE CONNECTING 12'X1/4 (SUCTIONS) ×2
TUBE CONNECTING 12X1/4 (SUCTIONS) ×3 IMPLANT
UNDERPAD 30X30 (UNDERPADS AND DIAPERS) ×5 IMPLANT
WATER STERILE IRR 1000ML POUR (IV SOLUTION) ×3 IMPLANT
YANKAUER SUCT BULB TIP NO VENT (SUCTIONS) ×3 IMPLANT

## 2017-02-21 NOTE — Anesthesia Procedure Notes (Signed)
Procedure Name: Intubation Date/Time: 02/21/2017 3:52 PM Performed by: Geraldo Docker Pre-anesthesia Checklist: Patient identified, Patient being monitored, Timeout performed, Emergency Drugs available and Suction available Patient Re-evaluated:Patient Re-evaluated prior to induction Oxygen Delivery Method: Circle System Utilized Preoxygenation: Pre-oxygenation with 100% oxygen Induction Type: IV induction, Rapid sequence and Cricoid Pressure applied Laryngoscope Size: Miller and 3 Grade View: Grade I Tube type: Oral Tube size: 8.0 mm Number of attempts: 1 Airway Equipment and Method: Stylet Placement Confirmation: ETT inserted through vocal cords under direct vision,  positive ETCO2 and breath sounds checked- equal and bilateral Secured at: 22 cm Tube secured with: Tape Dental Injury: Teeth and Oropharynx as per pre-operative assessment

## 2017-02-21 NOTE — Transfer of Care (Signed)
Immediate Anesthesia Transfer of Care Note  Patient: Antonio Wright  Procedure(s) Performed: Wound Exploration Right Forearm And Repair As Needed (Right Arm Lower)  Patient Location: PACU  Anesthesia Type:General  Level of Consciousness: drowsy and patient cooperative  Airway & Oxygen Therapy: Patient Spontanous Breathing and Patient connected to face mask oxygen  Post-op Assessment: Report given to RN and Post -op Vital signs reviewed and stable  Post vital signs: Reviewed and stable  Last Vitals:  Vitals:   02/21/17 1515 02/21/17 1820  BP: (!) 163/93 (!) 93/56  Pulse: (!) 53 71  Resp: 18 10  Temp:  (!) 36.3 C  SpO2: 97% 100%    Last Pain:  Vitals:   02/21/17 1520  TempSrc:   PainSc: 10-Worst pain ever         Complications: No apparent anesthesia complications

## 2017-02-21 NOTE — ED Triage Notes (Signed)
Pt presents with tourniquet applied to R bicep (at 1408) due to laceration to R anterior forearm.  Pt punched plate glass door, fentanyl given via PIV 18g L AC. + LOC at scene

## 2017-02-21 NOTE — ED Notes (Signed)
PA Earney Hamburg at the bedside

## 2017-02-21 NOTE — Op Note (Signed)
PREOPERATIVE DIAGNOSIS: Right forearm traumatic laceration  9 cm with nerve tendon and artery, complicated traumatic laceration  POSTOPERATIVE DIAGNOSIS: Same  ATTENDING SURGEON: Dr. Gilman Schmidt who was scrubbed and present for the entire procedure  ASSISTANT SURGEON: Lambert Mody PA-C who was scrubbed and necessary for the entire procedure who helped aid in nerve tendon artery repair closure and splinting  ANESTHESIA: Gen. via LMA  OPERATIVE PROCEDURE: #1: Right forearm flexor repair, flexor carpi radialis #2: Right forearm flexor repair, brachia radialis #3: Right forearm flexor superficialis repair, muscle, musculotendinous junction #4: Right thumb abductor pollicis longus tendon repair #5: Right thumb extensor pollicis brevis tendon repair #6: Right forearm superficial branch of the radial nerve repair #7: Right forearm radial artery primary arterial repair #8: Right forearm traumatic laceration repair 9 cm #9: Right forearm median nerve neuroplasty and nerve exploration  IMPLANTS: none  RADIOGRAPHIC INTERPRETATION: none  SURGICAL INDICATIONS: Antonio Wright is a right-hand-dominant gentleman who sustained a traumatic laceration to his right forearm. Patient was seen and evaluated in the emergency room and recommended undergo wound exploration emergently to the active bleeding. Emergent consent was obtained.  SURGICAL TECHNIQUE: patient is properly identified in the preoperative holding area and a mark with a permanent marker made on the right arm to indicate correct upper site. Patient was actively bleeding in the preoperative area. Patient was taken emergently back to the operating room where general anesthesia was administered via endotracheal tube. Patient tolerated this well. Preoperative antibiotics were given. A well-padded tourniquet was then placed on the right brachium and sealed with the appropriate drape. The right upper extremity was then prepped and draped in normal  sterile fashion. Timeout was called cracks I was identified the procedure then begun. Attention was then turned to the right forearm. The limb was elevated and the tourniquet insufflated. Skin flaps were then raised proximally distally. Careful dissection was then carried out of the lacerated structures. The patient did have the intact flexor pollicis longus, the FDS to the index long ring and small were in continuity. Did not extend into the flexor digitorum profundus. The FPL was then continuity. The median nerve was then carefully dissected free and the median nerve was free without laceration. Dissection was then carried down through the lacerated structures. Attention was then turned to tendon repair. With FiberWire suture and Vicryl suture the FCR and the brachia radialis were then carefully repaired. This was done with 201 3-0 FiberWire and 2-0 Vicryl suture.  After flexor repair the fascia the flexor superficialis was then carefully repaired at the musculotendinous junction. This was done with 2-0 Vicryl suture. The abductor and extensor pollicis brevis then repaired with 3-0 FiberWire suture. Figure-of-eight and horizontal mattress sutures. Careful attention was then turned the superficial branch of the radial nerve which was carefully dissected free and using epineurial repair techniques this is repaired primarily with 8-0 nylon suture. After repair of the nerve attention was then turned to the artery repair. The artery was carefully dissected free. After careful dissection of the artery both ends of the artery were thoroughly irrigated and the thrombus was then evacuated. The tourniquet had been deflated and there is good flow proximally distally through the artery. Vessel clamps were then applied to the artery and the artery was then repaired primarily with 7-0 Prolene suture. The clamps were then removed and there is good flow through the arterial repair. The wound was irrigated the fascial layer again  directly over the superficial branch the radial nerve was repaired. The  skin was then repaired with 4-0 Prolene and 3-0 Prolene. The traumatic laceration was repaired with 3-0 Prolene suture. This is 9 cm  laceration. Adaptic dressing and a sterile compressive bandage then applied. The patient was then placed in a well-padded thumb spica splint keeping the wrist in slight flexion protecting the FCR repair. Patient is then debated taken recovery room in good condition.  Procedure plan: Patient be discharged to home in the morning admitted overnight for pain control IV antibiotics and hydration. Discharged in the morning follow-up in the office in 2 weeks for wound check suture removal short arm thumb spica cast for a total of 4 weeks immobilization and then begin outpatient therapy regimen.  POSTOPERATIVE PLAN:

## 2017-02-21 NOTE — Progress Notes (Signed)
Patient denies any suicidal thoughts/ideations on arrival to PACU.  States that he has had a lot going on, but he "definitely isn't thinking about harming or killing himself."  Patient transferred to 5N22.  Monica, RN at bedside is aware of the situation.
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
## 2017-02-21 NOTE — Discharge Instructions (Signed)
KEEP BANDAGE CLEAN AND DRY CALL OFFICE FOR F/U APPT 545-5000 in 2 weeks KEEP HAND ELEVATED ABOVE HEART OK TO APPLY ICE TO OPERATIVE AREA CONTACT OFFICE IF ANY WORSENING PAIN OR CONCERNS.  
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
## 2017-02-21 NOTE — Anesthesia Preprocedure Evaluation (Addendum)
Anesthesia Evaluation  Patient identified by MRN, date of birth, ID band Patient awake    Reviewed: Allergy & Precautions, NPO status , Patient's Chart, lab work & pertinent test resultsPreop documentation limited or incomplete due to emergent nature of procedure.  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,   Endo/Other  negative endocrine ROS  Renal/GU negative Renal ROS     Musculoskeletal   Abdominal   Peds  Hematology   Anesthesia Other Findings   Reproductive/Obstetrics                           Anesthesia Physical Anesthesia Plan  ASA: IV and emergent  Anesthesia Plan: General   Post-op Pain Management:    Induction: Intravenous, Rapid sequence and Cricoid pressure planned  PONV Risk Score and Plan: Ondansetron, Dexamethasone and Midazolam  Airway Management Planned: Oral ETT  Additional Equipment:   Intra-op Plan:   Post-operative Plan: Possible Post-op intubation/ventilation and Extubation in OR  Informed Consent: I have reviewed the patients History and Physical, chart, labs and discussed the procedure including the risks, benefits and alternatives for the proposed anesthesia with the patient or authorized representative who has indicated his/her understanding and acceptance.   Dental advisory given and Only emergency history available  Plan Discussed with: Anesthesiologist, Surgeon and CRNA  Anesthesia Plan Comments:       Anesthesia Quick Evaluation

## 2017-02-21 NOTE — ED Notes (Signed)
Pt sts he wants to leave. Asked pt if the pain medicine was working now pt sts "unless you're going to let me leave, don't talk to me." // EDP and Casimiro Needle, PA at bedside
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
## 2017-02-21 NOTE — Progress Notes (Signed)
Late entry.  Patient arrived in short stay at approximately 1540. Dahlia Client, EDRN holding direct pressure to right forearm per Dahlia Client, RN, tourniquet taken down in ED per surgeon, after tourniquet was taken down per Dahlia Client direct pressure had to be applied to control bleeding. Dahlia Client reports that bleeding was able to be controlled with direct pressure. Dahlia Client reported that patient felt a pop during transport and then started with uncontrollable bleeding from right forearm. Blood pouring from underneath dressing.   Patient with disposable BP cuff on right arm from ED. BP cuff inflated to ~ 180 mmHg and clamped with 2 hemostats to use as a tourniquet. Bleeding slowed down. Dressing took down to determine where pressure needed to be held as dressing was saturated and location of wound was unable to be determined. Placed additional dressing over area and this RN took over holding direct pressure. Upon patient arrival to short stay called for Dr. Noreene Larsson and OR staff urgently.   T. Scronce and L. Forte assisted in getting IV fluid up. This RN went back to OR with patient holding direct pressure. Consent was unable to be signed due to life threatening bleeding.  Note: Prior to patient arrival Aggie Cosier, Charge PACU nurse made aware that per Dahlia Client, RN patient had expressed suicidial ideations in ED. Patient was not alone during time in short stay and was urgently taken to OR.

## 2017-02-21 NOTE — OR Nursing (Signed)
1805: in&out cath=250cc cyu, per protocol, no trauma.

## 2017-02-22 ENCOUNTER — Emergency Department (HOSPITAL_COMMUNITY)
Admission: EM | Admit: 2017-02-22 | Discharge: 2017-02-22 | Disposition: A | Discharge status: Home / Self Care | Payer: Self-pay | Attending: Emergency Medicine | Admitting: Emergency Medicine

## 2017-02-22 ENCOUNTER — Encounter (HOSPITAL_COMMUNITY): Payer: Self-pay | Admitting: Emergency Medicine

## 2017-02-22 DIAGNOSIS — R55 Syncope and collapse: Secondary | ICD-10-CM | POA: Insufficient documentation

## 2017-02-22 DIAGNOSIS — F17210 Nicotine dependence, cigarettes, uncomplicated: Secondary | ICD-10-CM | POA: Insufficient documentation

## 2017-02-22 DIAGNOSIS — G8918 Other acute postprocedural pain: Secondary | ICD-10-CM | POA: Insufficient documentation

## 2017-02-22 DIAGNOSIS — Z79899 Other long term (current) drug therapy: Secondary | ICD-10-CM | POA: Insufficient documentation

## 2017-02-22 LAB — TYPE AND SCREEN
ABO/RH(D): O POS
Antibody Screen: NEGATIVE
UNIT DIVISION: 0
UNIT DIVISION: 0
Unit division: 0
Unit division: 0

## 2017-02-22 LAB — CBC
HCT: 28.8 % — ABNORMAL LOW (ref 39.0–52.0)
Hemoglobin: 9.8 g/dL — ABNORMAL LOW (ref 13.0–17.0)
MCH: 30 pg (ref 26.0–34.0)
MCHC: 34 g/dL (ref 30.0–36.0)
MCV: 88.1 fL (ref 78.0–100.0)
PLATELETS: 157 10*3/uL (ref 150–400)
RBC: 3.27 MIL/uL — ABNORMAL LOW (ref 4.22–5.81)
RDW: 12.2 % (ref 11.5–15.5)
WBC: 12.8 10*3/uL — ABNORMAL HIGH (ref 4.0–10.5)

## 2017-02-22 LAB — POCT I-STAT 4, (NA,K, GLUC, HGB,HCT)
GLUCOSE: 139 mg/dL — AB (ref 65–99)
Glucose, Bld: 116 mg/dL — ABNORMAL HIGH (ref 65–99)
HCT: 26 % — ABNORMAL LOW (ref 39.0–52.0)
HEMATOCRIT: 32 % — AB (ref 39.0–52.0)
HEMOGLOBIN: 10.9 g/dL — AB (ref 13.0–17.0)
Hemoglobin: 8.8 g/dL — ABNORMAL LOW (ref 13.0–17.0)
POTASSIUM: 4.1 mmol/L (ref 3.5–5.1)
Potassium: 4.1 mmol/L (ref 3.5–5.1)
SODIUM: 143 mmol/L (ref 135–145)
Sodium: 143 mmol/L (ref 135–145)

## 2017-02-22 LAB — BPAM RBC
BLOOD PRODUCT EXPIRATION DATE: 201811052359
Blood Product Expiration Date: 201811052359
Blood Product Expiration Date: 201811052359
Blood Product Expiration Date: 201811052359
ISSUE DATE / TIME: 201810081639
ISSUE DATE / TIME: 201810081639
ISSUE DATE / TIME: 201810081639
ISSUE DATE / TIME: 201810081639
UNIT TYPE AND RH: 5100
UNIT TYPE AND RH: 5100
Unit Type and Rh: 5100
Unit Type and Rh: 5100

## 2017-02-22 LAB — BASIC METABOLIC PANEL
Anion gap: 10 (ref 5–15)
BUN: 7 mg/dL (ref 6–20)
CALCIUM: 9.2 mg/dL (ref 8.9–10.3)
CHLORIDE: 106 mmol/L (ref 101–111)
CO2: 22 mmol/L (ref 22–32)
CREATININE: 1.19 mg/dL (ref 0.61–1.24)
GFR calc Af Amer: >60 mL/min (ref >60)
GFR calc non Af Amer: >60 mL/min (ref >60)
Glucose, Bld: 128 mg/dL — ABNORMAL HIGH (ref 65–99)
Potassium: 3.9 mmol/L (ref 3.5–5.1)
SODIUM: 138 mmol/L (ref 135–145)

## 2017-02-22 LAB — URINALYSIS, ROUTINE W REFLEX MICROSCOPIC
BILIRUBIN URINE: NEGATIVE
Glucose, UA: NEGATIVE mg/dL
HGB URINE DIPSTICK: NEGATIVE
KETONES UR: 5 mg/dL — AB
Leukocytes, UA: NEGATIVE
Nitrite: NEGATIVE
PROTEIN: NEGATIVE mg/dL
Specific Gravity, Urine: 1.006 (ref 1.005–1.030)
pH: 7 (ref 5.0–8.0)

## 2017-02-22 LAB — CBG MONITORING, ED: Glucose-Capillary: 120 mg/dL — ABNORMAL HIGH (ref 65–99)

## 2017-02-22 MED ORDER — ONDANSETRON HCL 4 MG/2ML IJ SOLN
4.0000 mg | Freq: Once | INTRAMUSCULAR | Status: AC
  Administered 2017-02-22: 4 mg via INTRAVENOUS
  Filled 2017-02-22: qty 2

## 2017-02-22 MED ORDER — HYDROMORPHONE HCL 1 MG/ML IJ SOLN
1.0000 mg | Freq: Once | INTRAMUSCULAR | Status: AC
  Administered 2017-02-22: 1 mg via INTRAVENOUS
  Filled 2017-02-22: qty 1

## 2017-02-22 MED ORDER — HYDROCODONE-ACETAMINOPHEN 5-325 MG PO TABS: 1.0000 | ORAL_TABLET | ORAL | 0 refills | Status: DC | PRN
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
## 2017-02-22 NOTE — ED Notes (Signed)
Pt anxious and tearful during assessment taking short breaths. C/o lightheadedness, Pt instructed to take slow even breaths.
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
## 2017-02-22 NOTE — Anesthesia Postprocedure Evaluation (Signed)
Anesthesia Post Note  Patient: Desmen Schoffstall  Procedure(s) Performed: Wound Exploration Right Forearm And Repair As Needed (Right Arm Lower)     Patient location during evaluation: PACU Anesthesia Type: General Level of consciousness: sedated Pain management: pain level controlled Vital Signs Assessment: post-procedure vital signs reviewed and stable Respiratory status: spontaneous breathing and respiratory function stable Cardiovascular status: stable Postop Assessment: no apparent nausea or vomiting Anesthetic complications: no    Last Vitals:  Vitals:   02/21/17 1935 02/21/17 1951  BP: 102/65 (!) 110/53  Pulse: (!) 53 (!) 50  Resp: 10 12  Temp: (!) 36.4 C (!) 36.4 C  SpO2: 100% 100%    Last Pain:  Vitals:   02/21/17 1951  TempSrc: Oral  PainSc:                  Debbrah Sampedro DANIEL

## 2017-02-22 NOTE — Anesthesia Postprocedure Evaluation (Signed)
Anesthesia Post Note  Patient: Antonio Wright  Procedure(s) Performed: Wound Exploration Right Forearm And Repair As Needed (Right Arm Lower)     Anesthesia Post Evaluation  Last Vitals:  Vitals:   02/21/17 1935 02/21/17 1951  BP: 102/65 (!) 110/53  Pulse: (!) 53 (!) 50  Resp: 10 12  Temp: (!) 36.4 C (!) 36.4 C  SpO2: 100% 100%    Last Pain:  Vitals:   02/21/17 1951  TempSrc: Oral  PainSc:                  Antonio Wright DANIEL

## 2017-03-29 ENCOUNTER — Ambulatory Visit: Payer: Self-pay | Attending: Orthopedic Surgery | Admitting: Occupational Therapy

## 2017-03-29 DIAGNOSIS — M25621 Stiffness of right elbow, not elsewhere classified: Secondary | ICD-10-CM | POA: Insufficient documentation

## 2017-03-29 DIAGNOSIS — M25631 Stiffness of right wrist, not elsewhere classified: Secondary | ICD-10-CM | POA: Insufficient documentation

## 2017-03-29 DIAGNOSIS — M6281 Muscle weakness (generalized): Secondary | ICD-10-CM | POA: Insufficient documentation

## 2017-03-29 DIAGNOSIS — M79641 Pain in right hand: Secondary | ICD-10-CM | POA: Insufficient documentation

## 2017-03-29 DIAGNOSIS — M25641 Stiffness of right hand, not elsewhere classified: Secondary | ICD-10-CM | POA: Insufficient documentation

## 2017-03-29 NOTE — Patient Instructions (Signed)
   AROM: Elbow Flexion / Extension    Gently bend elbow as far as possible. Then straighten arm as far as possible. Repeat _10-15___ times per set. Do _6-8___ sessions per day.    Elbow / Wrist Supination / Pronation   Bend Rt elbow(s) to 90 and hold close to body. Turn palm(s) up. Then turn palm(s) down. Keep wrist straight. Repeat sequence _10-15___ times per session. Do _6-8__ sessions per day.   AROM: Wrist Extension   .  With __Rt__ palm down, bend wrist up. Repeat __15__ times per set.  Do __6-8__ sessions per day.    AROM: Wrist Flexion   With__Rt___ palm up, bend wrist up. Repeat __15__ times per set.  Do _6-8__ sessions per day.   Opposition (Active)   Touch tip of thumb to nail tip of each finger in turn, making an "O" shape. Repeat __10__ times. Do _6-8__ sessions per day.   MP Flexion (Active)   Bend thumb to touch base of little finger, like making a "4". Then out to side for "5" position Repeat __10-15__ times each way. Do _6-8__ sessions per day.      IP Flexion (Active Blocked)   Brace thumb below tip joint. Bend joint as far as possible. Repeat __10__ times. Do _4-6___ sessions per day.    AROM: Thumb Abduction / Adduction    Actively pull right thumb away from palm as far as possible. Hold __3__ seconds. Then bring thumb back to touch fingers. Try not to bend fingers toward thumb. Repeat _15___ times per set. Do ____ sets per session. Do __6-8__ sessions per day.

## 2017-03-29 NOTE — Therapy (Signed)
[facility] [address] [place], [place], [address] Phone: [phone]   Fax:  [phone]  Occupational Therapy Evaluation  Patient Details  Name: Antonio Wright MRN: [id number] Date of Birth: 01/19/1986 Referring Provider: Dr. Melvyn Novas   Encounter Date: 03/29/2017  OT End of Session - 03/29/17 1158    Visit Number  1    Number of Visits  8    Date for OT Re-Evaluation  05/27/17    Authorization Type  self pay    OT Start Time  1015    OT Stop Time  1140    OT Time Calculation (min)  85 min    Activity Tolerance  Patient tolerated treatment well    Behavior During Therapy  [facility] for tasks assessed/performed       Past Medical History:  Diagnosis Date  . Chronic back pain     No past surgical history on file.  There were no vitals filed for this visit.  Subjective Assessment - 03/29/17 1023    Pertinent History  s/p EPB and APL repair 02/21/17, radial and median n. repair,  flexor superficialis and brachial radialis repair    Patient Stated Goals  Being able to move my thumb again and use my hand, being able to write    Currently in Pain?  Yes    Pain Score  5     Pain Location  -- thumb    Pain Orientation  Right    Pain Descriptors / Indicators  Tightness;Sharp    Pain Type  Surgical pain;Acute pain    Pain Onset  More than a month ago    Pain Frequency  Intermittent    Aggravating Factors   movement, bumping into it    Pain Relieving Factors  rest, elevation        [facility] OT Assessment - 03/29/17 0001      Assessment   Diagnosis  s/p Rt forearm laceration with EPB and APL repair of Rt thumb    Referring Provider  Dr. Melvyn Novas    Onset Date  02/21/17 surgery date    Assessment  Pt arrived with cast on - pt has been immobolized for 5 weeks as pt is now 5 weeks post-op. Pt had initial soft post surgical cast for 2 weeks, then casted for 3 weeks.     Prior Therapy  none for this episode      Precautions   Precaution Comments  no strengthening Rt wrist/hand      Balance Screen   Has the patient fallen in the past 6 months  No    Has the patient had a decrease in activity level because of a fear of falling?   No    Is the patient reluctant to leave their home because of a fear of falling?   No      Home  Environment   Additional Comments  Pt lives with mother, girlfriend, and 2 youngest children (ages 1 and 3)     Lives With  Family      Prior Function   Level of Independence  Independent    Vocation  Full time employment pt was working in [employer] prior to injury    [employer]  no longer at prior employment      ADL   ADL comments  Pt requires assist for all ADLS at this time as pt is Rt handed. Although pt instructed to start using his Rt hand for light BADLS  including: writing, eating, bathing      Written Expression   Dominant Hand  Right    Handwriting  -- unable to write w/ regular pen, can w/ built up pen      Sensation   Additional Comments  numbness along radial n. distribution (dorsally at thumb, index, long and back of hand)       Edema   Edema  mild Rt hand      ROM / Strength   AROM / PROM / Strength  AROM      AROM   Overall AROM Comments  elbow flex = 145*, ext = -35*, supination = 88*, pronation = 50*, wrist flex = 60*, ext = 10*. Digits 2-5 WNL's. See below for thumb ROM      Right Hand AROM   R Thumb MCP 0-60  50 Degrees    R Thumb IP 0-80  45 Degrees    R Thumb Radial ABduction/ADduction 0-55  45    R Thumb Palmar ABduction/ADduction 0-45  68    R Thumb Opposition to Index  -- To 5th digit               OT Treatments/Exercises (OP) - 03/29/17 0001      ADLs   ADL Comments  Removed cast and cleaned hand/forearm. Pt instructed in hygiene care, splint wear and care, precautions (no strengthening), and A/ROM HEP. Pt now 5 weeks post-op. Pt also practiced writing name with built up pen and issued tan foam to put on pen.        Exercises   Exercises  -- see pt instructions for A/ROM to elbow, forearm, wrist, hand      Splinting   Splinting  Pt is now 5 weeks post-op and does not need a thumb spica splint at this time per protocol that MD wants therapy to follow. However, fabricated thumb spica splint only to wear with childcare needs or going out in busy environment for protection. Pt instructed NOT to wear normally for light activities at home, and encouraged to perform HEP 6-8x/day            OT Education - 03/29/17 1105    Education provided  Yes    Education Details  A/ROM HEP for elbow, forearm, wrist and thumb, splint wear and care, A/E for writing, precautions    Person(s) Educated  Patient    Methods  Explanation;Demonstration;Handout    Comprehension  Verbalized understanding;Returned demonstration       OT Short Term Goals - 03/29/17 1206      OT SHORT TERM GOAL #1   Title  Independent with initial HEP     Time  4    Period  Weeks    Status  On-going    Target Date  04/27/17      OT SHORT TERM GOAL #2   Title  Independent with splint wear and care    Time  4    Period  Weeks    Status  On-going      OT SHORT TERM GOAL #3   Title  Pt to improve forearm pronation to 70 degrees or greater    Baseline  50 degrees    Time  4    Period  Weeks    Status  New      OT SHORT TERM GOAL #4   Title  Pt to improve wrist extension to 40 degrees or more for functional tasks    Baseline  10  degrees    Time  4    Period  Weeks    Status  New      OT SHORT TERM GOAL #5   Title  Pt to have thumb ROM WFL's for thumb flexion and radial abduction    Time  4    Period  Weeks    Status  New        OT Long Term Goals - 03/29/17 1209      OT LONG TERM GOAL #1   Title  Independent with updated HEP for strengthening    Time  8    Period  Weeks    Status  New    Target Date  05/27/17      OT LONG TERM GOAL #2   Title  Pt to return to using Rt hand as dominant hand for all BADLS including  writing    Time  8    Period  Weeks    Status  New      OT LONG TERM GOAL #3   Title  Grip strength Rt hand to be 50% or > of that compared to Lt hand    Time  8    Period  Weeks    Status  New      OT LONG TERM GOAL #4   Title  Lateral and 3 tip pinch Rt hand to each be 10 lbs or greater     Time  8    Period  Weeks    Status  New            Plan - 03/29/17 1200    Clinical Impression Statement  Pt is a 31 y.o. male who presents to outpatient rehab s/p Rt forearm laceration with EPB and APL repairs Rt thumb, as well as median and radial n. repair, superficialis and brachial radialis repair on 02/21/17. Pt now 5 weeks post-op. Pt arrives with cast (split for removal) and has been immobolized for a total of 5 weeks. Pt here today for evaluation, splinting, and to begin A/ROM.     Occupational Profile and client history currently impacting functional performance  Pt with no significant PMH, but current deficits in Rt dominant hand including: pain, ROM, strength, and numbness affecting pt's ability to perform ADLS, childcare needs, and work related tasks     Occupational performance deficits (Please refer to evaluation for details):  ADL's;IADL's;Rest and Sleep;Work;Leisure;Social Participation    Rehab Potential  Good    OT Frequency  1x / week Due to self pay    OT Duration  8 weeks    OT Treatment/Interventions  Self-care/ADL training;Moist Heat;Fluidtherapy;DME and/or AE instruction;Splinting;Patient/family education;Contrast Bath;Compression bandaging;Therapeutic exercises;Therapeutic activities;Scar mobilization;Ultrasound;Cryotherapy;Passive range of motion;Electrical Stimulation;Parrafin;Manual Therapy    Plan  assess splint, review A/ROM HEP, begin light strengthening wrist and hand per protocol    Clinical Decision Making  Limited treatment options, no task modification necessary    Consulted and Agree with Plan of Care  Patient       Patient will benefit from skilled  therapeutic intervention in order to improve the following deficits and impairments:  Pain, Impaired sensation, Decreased mobility, Decreased scar mobility, Decreased strength, Impaired UE functional use, Decreased range of motion  Visit Diagnosis: Stiffness of right hand, not elsewhere classified - Plan: Ot plan of care cert/re-cert  Stiffness of right wrist, not elsewhere classified - Plan: Ot plan of care cert/re-cert  Stiffness of right elbow, not elsewhere classified - Plan: Ot plan of care  cert/re-cert  Muscle weakness (generalized) - Plan: Ot plan of care cert/re-cert  Pain in right hand - Plan: Ot plan of care cert/re-cert    Problem List Patient Active Problem List   Diagnosis Date Noted  . Forearm laceration, right, initial encounter 02/21/2017  . Substance induced mood disorder (HCC) 12/03/2014  . 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 [place], [place], [address] Phone: [phone]   Fax:  [phone]  Name: Arlan Birks MRN: [id number] Date of Birth: 09-01-1985

## 2017-04-12 ENCOUNTER — Ambulatory Visit: Payer: Self-pay | Admitting: Occupational Therapy

## 2017-04-19 ENCOUNTER — Ambulatory Visit: Payer: Self-pay | Attending: Orthopedic Surgery | Admitting: Occupational Therapy

## 2017-04-26 ENCOUNTER — Encounter: Payer: Self-pay | Admitting: Occupational Therapy

## 2017-05-03 ENCOUNTER — Ambulatory Visit: Payer: Self-pay | Admitting: Occupational Therapy

## 2017-05-05 NOTE — Therapy (Signed)
[facility] [address] [place], [place], [address] Phone: [phone]   Fax:  [phone]  Patient Details  Name: Antonio Wright MRN: [id number] Date of Birth: 1986-01-30 Referring Provider:  Dr. Melvyn Novasrtmann Encounter Date: 05/05/2017  Pt has not returned after initial O.T. evaluation and treatment on 03/29/17. Pt has cancelled once and no showed twice since then. Will d/c episode of care at this time.   Kelli ChurnBallie, Jancy Sprankle Pangle, OTR/L 05/05/2017, 8:09 AM  [facility] [address] [place], [place], [address] Phone: [phone]   Fax:  [phone]

## 2017-07-11 ENCOUNTER — Other Ambulatory Visit: Payer: Self-pay

## 2017-07-11 ENCOUNTER — Inpatient Hospital Stay (HOSPITAL_COMMUNITY)
Admission: AD | Admit: 2017-07-11 | Discharge: 2017-07-18 | DRG: 885 | Disposition: A | Discharge status: Home / Self Care | Payer: Federal, State, Local not specified - Other | Attending: Psychiatry | Admitting: Psychiatry

## 2017-07-11 ENCOUNTER — Encounter (HOSPITAL_COMMUNITY): Payer: Self-pay | Admitting: Emergency Medicine

## 2017-07-11 ENCOUNTER — Emergency Department (HOSPITAL_COMMUNITY)
Admission: EM | Admit: 2017-07-11 | Discharge: 2017-07-11 | Disposition: A | Discharge status: Other Acute Inpatient Hospital | Payer: Self-pay | Attending: Emergency Medicine | Admitting: Emergency Medicine

## 2017-07-11 ENCOUNTER — Encounter (HOSPITAL_COMMUNITY): Payer: Self-pay

## 2017-07-11 DIAGNOSIS — F17210 Nicotine dependence, cigarettes, uncomplicated: Secondary | ICD-10-CM | POA: Diagnosis not present

## 2017-07-11 DIAGNOSIS — G4700 Insomnia, unspecified: Secondary | ICD-10-CM | POA: Diagnosis present

## 2017-07-11 DIAGNOSIS — F329 Major depressive disorder, single episode, unspecified: Secondary | ICD-10-CM | POA: Insufficient documentation

## 2017-07-11 DIAGNOSIS — G8929 Other chronic pain: Secondary | ICD-10-CM | POA: Diagnosis present

## 2017-07-11 DIAGNOSIS — F315 Bipolar disorder, current episode depressed, severe, with psychotic features: Secondary | ICD-10-CM | POA: Diagnosis not present

## 2017-07-11 DIAGNOSIS — Z833 Family history of diabetes mellitus: Secondary | ICD-10-CM

## 2017-07-11 DIAGNOSIS — F3130 Bipolar disorder, current episode depressed, mild or moderate severity, unspecified: Principal | ICD-10-CM | POA: Diagnosis present

## 2017-07-11 DIAGNOSIS — F419 Anxiety disorder, unspecified: Secondary | ICD-10-CM | POA: Diagnosis not present

## 2017-07-11 DIAGNOSIS — Z79899 Other long term (current) drug therapy: Secondary | ICD-10-CM | POA: Insufficient documentation

## 2017-07-11 DIAGNOSIS — R45851 Suicidal ideations: Secondary | ICD-10-CM | POA: Diagnosis not present

## 2017-07-11 DIAGNOSIS — Z7982 Long term (current) use of aspirin: Secondary | ICD-10-CM | POA: Diagnosis not present

## 2017-07-11 DIAGNOSIS — Z915 Personal history of self-harm: Secondary | ICD-10-CM | POA: Diagnosis not present

## 2017-07-11 DIAGNOSIS — M549 Dorsalgia, unspecified: Secondary | ICD-10-CM | POA: Diagnosis present

## 2017-07-11 DIAGNOSIS — G8921 Chronic pain due to trauma: Secondary | ICD-10-CM | POA: Insufficient documentation

## 2017-07-11 DIAGNOSIS — R450 Nervousness: Secondary | ICD-10-CM | POA: Diagnosis not present

## 2017-07-11 DIAGNOSIS — Z818 Family history of other mental and behavioral disorders: Secondary | ICD-10-CM

## 2017-07-11 DIAGNOSIS — F1290 Cannabis use, unspecified, uncomplicated: Secondary | ICD-10-CM | POA: Diagnosis not present

## 2017-07-11 LAB — COMPREHENSIVE METABOLIC PANEL
ALBUMIN: 4.7 g/dL (ref 3.5–5.0)
ALK PHOS: 80 U/L (ref 38–126)
ALT: 11 U/L — ABNORMAL LOW (ref 17–63)
ANION GAP: 12 (ref 5–15)
AST: 20 U/L (ref 15–41)
BILIRUBIN TOTAL: 0.7 mg/dL (ref 0.3–1.2)
BUN: 12 mg/dL (ref 6–20)
CALCIUM: 9.1 mg/dL (ref 8.9–10.3)
CO2: 24 mmol/L (ref 22–32)
Chloride: 106 mmol/L (ref 101–111)
Creatinine, Ser: 1.28 mg/dL — ABNORMAL HIGH (ref 0.61–1.24)
GFR calc Af Amer: >60 mL/min (ref >60)
GLUCOSE: 109 mg/dL — AB (ref 65–99)
Potassium: 3.6 mmol/L (ref 3.5–5.1)
Sodium: 142 mmol/L (ref 135–145)
TOTAL PROTEIN: 7.8 g/dL (ref 6.5–8.1)

## 2017-07-11 LAB — ETHANOL

## 2017-07-11 LAB — CBC WITH DIFFERENTIAL/PLATELET
BASOS PCT: 0 %
Basophils Absolute: 0 10*3/uL (ref 0.0–0.1)
Eosinophils Absolute: 0.1 10*3/uL (ref 0.0–0.7)
Eosinophils Relative: 1 %
HEMATOCRIT: 40.9 % (ref 39.0–52.0)
HEMOGLOBIN: 13.9 g/dL (ref 13.0–17.0)
LYMPHS ABS: 2.6 10*3/uL (ref 0.7–4.0)
LYMPHS PCT: 34 %
MCH: 29.5 pg (ref 26.0–34.0)
MCHC: 34 g/dL (ref 30.0–36.0)
MCV: 86.8 fL (ref 78.0–100.0)
MONO ABS: 0.7 10*3/uL (ref 0.1–1.0)
MONOS PCT: 9 %
NEUTROS ABS: 4.2 10*3/uL (ref 1.7–7.7)
NEUTROS PCT: 56 %
Platelets: 219 10*3/uL (ref 150–400)
RBC: 4.71 MIL/uL (ref 4.22–5.81)
RDW: 13.8 % (ref 11.5–15.5)
WBC: 7.6 10*3/uL (ref 4.0–10.5)

## 2017-07-11 LAB — RAPID URINE DRUG SCREEN, HOSP PERFORMED
Amphetamines: NOT DETECTED
BARBITURATES: NOT DETECTED
Benzodiazepines: NOT DETECTED
COCAINE: NOT DETECTED
Opiates: NOT DETECTED
Tetrahydrocannabinol: POSITIVE — AB

## 2017-07-11 MED ORDER — ALUM & MAG HYDROXIDE-SIMETH 200-200-20 MG/5ML PO SUSP: 30.0000 mL | Freq: Four times a day (QID) | ORAL | Status: DC | PRN

## 2017-07-11 MED ORDER — OLANZAPINE 10 MG PO TBDP: 10.0000 mg | ORAL_TABLET | Freq: Three times a day (TID) | ORAL | Status: DC | PRN

## 2017-07-11 MED ORDER — ALUM & MAG HYDROXIDE-SIMETH 200-200-20 MG/5ML PO SUSP: 30.0000 mL | ORAL | Status: DC | PRN

## 2017-07-11 MED ORDER — ZIPRASIDONE MESYLATE 20 MG IM SOLR: 20.0000 mg | INTRAMUSCULAR | Status: DC | PRN

## 2017-07-11 MED ORDER — MAGNESIUM HYDROXIDE 400 MG/5ML PO SUSP: 30.0000 mL | Freq: Every day | ORAL | Status: DC | PRN

## 2017-07-11 MED ORDER — NICOTINE 21 MG/24HR TD PT24
21.0000 mg | MEDICATED_PATCH | Freq: Every day | TRANSDERMAL | Status: DC
  Administered 2017-07-12 – 2017-07-18 (×7): 21 mg via TRANSDERMAL
  Filled 2017-07-11 (×9): qty 1

## 2017-07-11 MED ORDER — LORAZEPAM 1 MG PO TABS: 1.0000 mg | ORAL_TABLET | Freq: Two times a day (BID) | ORAL | Status: DC | PRN

## 2017-07-11 MED ORDER — HYDROXYZINE HCL 25 MG PO TABS
25.0000 mg | ORAL_TABLET | Freq: Three times a day (TID) | ORAL | Status: DC | PRN
  Administered 2017-07-11 – 2017-07-16 (×6): 25 mg via ORAL
  Filled 2017-07-11 (×2): qty 1
  Filled 2017-07-11: qty 10
  Filled 2017-07-11 (×4): qty 1

## 2017-07-11 MED ORDER — ONDANSETRON HCL 4 MG PO TABS: 4.0000 mg | ORAL_TABLET | Freq: Three times a day (TID) | ORAL | Status: DC | PRN

## 2017-07-11 MED ORDER — ACETAMINOPHEN 325 MG PO TABS
650.0000 mg | ORAL_TABLET | Freq: Four times a day (QID) | ORAL | Status: DC | PRN
  Administered 2017-07-12 – 2017-07-17 (×7): 650 mg via ORAL
  Filled 2017-07-11 (×7): qty 2

## 2017-07-11 MED ORDER — RISPERIDONE 0.5 MG PO TABS
0.5000 mg | ORAL_TABLET | Freq: Two times a day (BID) | ORAL | Status: DC
  Administered 2017-07-11 – 2017-07-12 (×3): 0.5 mg via ORAL
  Filled 2017-07-11 (×8): qty 1

## 2017-07-11 MED ORDER — ZIPRASIDONE MESYLATE 20 MG IM SOLR: 20.0000 mg | Freq: Two times a day (BID) | INTRAMUSCULAR | Status: DC | PRN

## 2017-07-11 MED ORDER — RISPERIDONE 0.5 MG PO TABS
0.5000 mg | ORAL_TABLET | Freq: Two times a day (BID) | ORAL | Status: DC
  Administered 2017-07-11: 0.5 mg via ORAL
  Filled 2017-07-11: qty 1

## 2017-07-11 MED ORDER — TRAZODONE HCL 50 MG PO TABS
50.0000 mg | ORAL_TABLET | Freq: Every evening | ORAL | Status: DC | PRN
  Administered 2017-07-11 – 2017-07-17 (×7): 50 mg via ORAL
  Filled 2017-07-11 (×5): qty 1
  Filled 2017-07-11: qty 7
  Filled 2017-07-11: qty 1

## 2017-07-11 MED ORDER — ACETAMINOPHEN 325 MG PO TABS
650.0000 mg | ORAL_TABLET | ORAL | Status: DC | PRN
  Administered 2017-07-11: 650 mg via ORAL
  Filled 2017-07-11: qty 2

## 2017-07-11 MED ORDER — NICOTINE 21 MG/24HR TD PT24
21.0000 mg | MEDICATED_PATCH | Freq: Every day | TRANSDERMAL | Status: DC
  Administered 2017-07-11: 21 mg via TRANSDERMAL
  Filled 2017-07-11: qty 1

## 2017-07-11 NOTE — ED Notes (Signed)
Patient resting in bed, alert and oriented compliant with medication administration.

## 2017-07-11 NOTE — Progress Notes (Signed)
Patient ID: Antonio Wright, male   DOB: 07-15-1985, 32 y.o.   MRN: [id number]  D: Patient mood and affect appeared depressed and flat. Pt denies SI/HI/AVH and pain. Pt has been isolative in his room but attended evening wrap up group.No behavioral issues noted.  A: Support and encouragement offered as needed to express needs. Medications administered as prescribed.  R: Patient is safe and cooperative on unit. Will continue to monitor  for safety and stability.

## 2017-07-11 NOTE — ED Notes (Signed)
Patient tearful with nurse saying he just spoke with his mother over the phone and she told him he needed to get help, so he thought he should also.

## 2017-07-11 NOTE — ED Notes (Signed)
Pt refusing to provide urine sample at this time or allow for labs to be done. Dr.Pollina has seen pt and is to initate IVC paperwork.

## 2017-07-11 NOTE — ED Notes (Signed)
Bed: [facility] Expected date:  Expected time:  Means of arrival:  Comments: 

## 2017-07-11 NOTE — ED Notes (Signed)
Patient reports SI with a plan ot jump off bridge. Patient is irritable and hostile at this time. Plan of care discussed. Encouragement and support provided and safety maintain. Q 15 min safety in place and video monitoring.

## 2017-07-11 NOTE — Tx Team (Signed)
Initial Treatment Plan 07/11/2017 6:36 PM Antonio Wright Elizardo ZOX:[id number]    PATIENT STRESSORS: Financial difficulties Legal issue Marital or family conflict   PATIENT STRENGTHS: Wellsite geologistCommunication skills General fund of knowledge Physical Health   PATIENT IDENTIFIED PROBLEMS: Depression  Suicidal ideation  "Managing my anger"  "Help with my separation anxiety because it makes me angry               DISCHARGE CRITERIA:  Improved stabilization in mood, thinking, and/or behavior Verbal commitment to aftercare and medication compliance  PRELIMINARY DISCHARGE PLAN: Outpatient therapy Medication management  PATIENT/FAMILY INVOLVEMENT: This treatment plan has been presented to and reviewed with the patient, Antonio Wright Cavell.  The patient and family have been given the opportunity to ask questions and make suggestions.  Levin BaconHeather V Francella Barnett, RN 07/11/2017, 6:36 PM
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
## 2017-07-11 NOTE — Progress Notes (Deleted)
Written in error

## 2017-07-11 NOTE — Progress Notes (Signed)
Adult Psychoeducational Group Note  Date:  07/11/2017 Time:  8:49 PM  Group Topic/Focus:  Wrap-Up Group:   The focus of this group is to help patients review their daily goal of treatment and discuss progress on daily workbooks.  Participation Level:  Minimal  Participation Quality:  Appropriate  Affect:  Flat  Cognitive:  Oriented  Insight: Limited  Engagement in Group:  Engaged  Modes of Intervention:  Socialization and Support  Additional Comments:  Patient attended and participated in group tonight. He reports having a slow day. He was at the hospital today. [facility] He dinner, spoke with his mother on the phone.  Lita MainsFrancis, Srihan Brutus [facility] 07/11/2017, 8:49 PM

## 2017-07-11 NOTE — ED Provider Notes (Signed)
[facility]-EMERGENCY DEPT Provider Note   CSN: [id number] Arrival date & time: 07/11/17  0141     History   Chief Complaint Chief Complaint  Patient presents with  . Suicidal    HPI Antonio Wright is a 32 y.o. male.  Patient presents to the emergency department for suicidal ideation.  Patient reportedly told family members that he was suicidal and wanted to kill himself.  They contacted police who had difficulty finding him.  They are able to call him on his phone and he was evasive.  He told him that he wanted to kill himself, would jump off of a bridge or get a gun and shoot himself.  He also told him he might commit "suicide by cop".  He evaded police for 2 hours, during which time they had to track his phone to find him.  At arrival to the ER he is tearful and agitated.  He has intermittently been aggressive, kicking the partition in the police car during transportation and then becoming tearful.  At arrival he tells me that he does not belong on this earth.  He confirms that he wants to die and kill himself.  Patient also complains of pain in his right arm.  This seems to be somewhat chronic.  He has a previous injury with a scar on his arm from a laceration repair.       Past Medical History:  Diagnosis Date  . Chronic back pain     Patient Active Problem List   Diagnosis Date Noted  . Forearm laceration, right, initial encounter 02/21/2017  . Substance induced mood disorder (HCC) 12/03/2014  . 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 I&D EXTREMITY Right 02/21/2017   Procedure: Wound Exploration Right Forearm And Repair As Needed;  Surgeon: Bradly Bienenstock, MD;  Location: [facility] OR;  Service: Orthopedics;  Laterality: Right;       Home Medications    Prior to Admission medications   Medication Sig Start Date End Date Taking? Authorizing Provider  aspirin EC 325 MG tablet Take 1 tablet (325 mg total) by  mouth daily. Patient not taking: Reported on 03/29/2017 02/21/17   Bradly Bienenstock, MD  docusate sodium (COLACE) 100 MG capsule Take 1 capsule (100 mg total) by mouth 2 (two) times daily. Patient not taking: Reported on 03/29/2017 02/21/17   Bradly Bienenstock, MD  docusate sodium (COLACE) 100 MG capsule Take 1 capsule (100 mg total) by mouth 2 (two) times daily. Patient not taking: Reported on 03/29/2017 02/21/17   Bradly Bienenstock, MD  HYDROcodone-acetaminophen (NORCO/VICODIN) 5-325 MG tablet Take 1 tablet by mouth every 4 (four) hours as needed for moderate pain. Patient not taking: Reported on 03/29/2017 02/22/17   Gilda Crease, MD  methocarbamol (ROBAXIN) 500 MG tablet Take 1 tablet (500 mg total) by mouth 3 (three) times daily. Patient not taking: Reported on 03/29/2017 02/21/17   Bradly Bienenstock, MD    Family History Family History  Problem Relation Age of Onset  . Diabetes Father   . Diabetes Other     Social History Social History   Tobacco Use  . Smoking status: Current Every Day Smoker    Packs/day: 0.50    Types: Cigarettes  . Smokeless tobacco: Never Used  Substance Use Topics  . Alcohol use: Yes    Comment: occassionally  . Drug use: Yes    Types: Marijuana    Comment: occ  Allergies   Patient has no known allergies.   Review of Systems Review of Systems  Musculoskeletal:       Arm pain  Psychiatric/Behavioral: Positive for dysphoric mood and suicidal ideas.     Physical Exam Updated Vital Signs BP (!) 160/59 (BP Location: Left Arm)   Pulse 86   Temp 98.1 F (36.7 C) (Oral)   Resp 18   Ht 6' (1.829 m)   Wt 72.6 kg (160 lb)   SpO2 97%   BMI 21.70 kg/m   Physical Exam  Constitutional: He is oriented to person, place, and time. He appears well-developed and well-nourished. No distress.  HENT:  Head: Normocephalic and atraumatic.  Right Ear: Hearing normal.  Left Ear: Hearing normal.  Nose: Nose normal.  Mouth/Throat: Oropharynx is clear and  moist and mucous membranes are normal.  Eyes: Conjunctivae and EOM are normal. Pupils are equal, round, and reactive to light.  Neck: Normal range of motion. Neck supple.  Cardiovascular: Regular rhythm, S1 normal and S2 normal. Exam reveals no gallop and no friction rub.  No murmur heard. Pulmonary/Chest: Effort normal and breath sounds normal. No respiratory distress. He exhibits no tenderness.  Abdominal: Soft. Normal appearance and bowel sounds are normal. There is no hepatosplenomegaly. There is no tenderness. There is no rebound, no guarding, no tenderness at McBurney's point and negative Murphy's sign. No hernia.  Musculoskeletal: Normal range of motion.  Previous laceration repair is well-healed on right forearm, area is tender to the touch without any signs of infection or acute problem  Neurological: He is alert and oriented to person, place, and time. He has normal strength. No cranial nerve deficit or sensory deficit. Coordination normal. GCS eye subscore is 4. GCS verbal subscore is 5. GCS motor subscore is 6.  Skin: Skin is warm, dry and intact. No rash noted. No cyanosis.  Psychiatric: His speech is delayed. He is withdrawn. He exhibits a depressed mood. He expresses suicidal ideation. He expresses suicidal plans.  Nursing note and vitals reviewed.    ED Treatments / Results  Labs (all labs ordered are listed, but only abnormal results are displayed) Labs Reviewed - No data to display  EKG  EKG Interpretation None       Radiology No results found.  Procedures Procedures (including critical care time)  Medications Ordered in ED Medications - No data to display   Initial Impression / Assessment and Plan / ED Course  I have reviewed the triage vital signs and the nursing notes.  Pertinent labs & imaging results that were available during my care of the patient were reviewed by me and considered in my medical decision making (see chart for details).    Patient  presents with agitation, depression and suicidal ideation.  He will require psychiatric evaluation.  Patient also complains of pain in his right arm.  This seems to be somewhat chronic.  He has a previous injury with a scar on his arm from a laceration repair.  The area is hyperesthetic and tender to the touch, but there does not appear to be any new injury.  He has normal distal sensation and motor function.  He does not require any workup for this.  Final Clinical Impressions(s) / ED Diagnoses   Final diagnoses:  Suicidal ideation    ED Discharge Orders    None       Andrius Andrepont, Canary Brimhristopher J, MD 07/11/17 [phone]

## 2017-07-11 NOTE — BH Assessment (Addendum)
[facility]  Per Juanetta BeetsJacqueline Norman, DO, this pt requires psychiatric hospitalization.  Malva LimesLinsey Strader, RN, [facility]m [id number]; she will call when [facility].  Pt presents under IVC, and [facility].  Pt's nurse, Aram BeechamCynthia, has been notified, and agrees to call report to [phone].  Pt is to be transported via [employer] examinerlaw enforcement.   Doylene Canninghomas Elier Zellars, [place] Behavioral Health Coordinator [phone]    Addendum:  Per Richelle ItoLinsey, [facility] between 16:00 and 16:30.  Pt's nurse, Aram BeechamCynthia, has been notified.  Doylene Canninghomas Caden Fukushima, [place] Behavioral Health Coordinator [phone]

## 2017-07-11 NOTE — ED Triage Notes (Signed)
Pt presents by [employer] after reporting suicidal ideation and wanting to jump off a bridge. [employer] reports that pt would have episodes of apologizing and then kicking the police car. Pt currently in custody of [employer].

## 2017-07-11 NOTE — BH Assessment (Addendum)
Assessment Note  Antonio Wright is an 32 y.o. male, who [name] Clinician entered the pt's room and introduced herself and her role. Pt reported, "you can't help me, it's still gonna happen." Clinician called the pt by his name. Pt reported, "that's not my name. Clinician asked the pt what was his name. Pt reported, "it doesn't matter, nobody believes me." Pt reported, "they wanted to prevent me from taking care of them." Clinician asked the pt who was "they?" Pt reported, "he's dead." Clinician asked, "who." Pt reported, [name] he cut an artery in his arm." Pt removed the cover from over his head and showed clinician his arm with a wrap over it. Clinician attempted to ask the pt questions during the assessment however pt refused to answer.   [facility] Per IVC paperwork: "Brought to ER by police. Told family he was suicidal, family called police. Police could not find him, called his phone, he tole police that he was going to jump off a bridge or shoot himself, would not tell police where he was. Police had to track his phone to find him. Threatened 'suicide by cop' when encountered. Tells me 'don't belong on this Earth' and he will 'end it all.'  Clinician was unable to assess the following: " substance abuse, history of abuse, living arrangements, OPT resources, previous suicide attempts, family suicide history, HI, access to weapons, AVH, orientation, sleep, appetite, legal involvement, DSS involvement, affect." Pt's UDS is pending.   Pt presents bizarre, quiet/awake in scrubs with covers over his head and his back facing clinician. Pt's eye contact was poor. Pt's mood was depressed,helpless, preoccupied. Pt's judgement is impaired. Pt's concentration, insight and impulse control are poor.   Diagnosis:  F33.2 Major Depressive Disorder, recurrent, severe.  Past Medical History:  Past Medical History:  Diagnosis Date  . Chronic back  pain     Past Surgical History:  Procedure Laterality Date  . I&D EXTREMITY Right 02/21/2017   Procedure: Wound Exploration Right Forearm And Repair As Needed;  Surgeon: Bradly Bienenstockrtmann, Fred, MD;  [facility];  Service: Orthopedics;  Laterality: Right;    Family History:  Family History  Problem Relation Age of Onset  . Diabetes Father   . Diabetes Other     Social History:  reports that he has been smoking cigarettes.  He has been smoking about 0.50 packs per day. he has never used smokeless tobacco. He reports that he drinks alcohol. He reports that he uses drugs. Drug: Marijuana.  Additional Social History:  Alcohol / Drug Use Pain Medications: See MAR Prescriptions: See MAR Over the Counter: See MAR History of alcohol / drug use?: (Pt's UDS pending.)  CIWA: CIWA-Ar BP: (!) 160/59 Pulse Rate: 86 COWS:    Allergies: No Known Allergies  Home Medications:  (Not in a hospital admission)  OB/GYN Status:  No LMP for male patient.  General Assessment Data [name] Assessment: In system Is this a Tele or Face-to-Face Assessment?: Face-to-Face Is this an Initial Assessment or a Re-assessment for this encounter?: Initial Assessment Marital status: Single Living Arrangements: Other (Comment)(UTA) Can pt return to current living arrangement?: (UTA) Admission Status: Involuntary Referral Source: Other([employer]) Insurance type: Self-pay.     Crisis Care Plan Living Arrangements: Other (Comment)(UTA) Legal Guardian: (UTA.) Name of Psychiatrist: UTA Name of Therapist: UTA  Education Status Is patient currently in school?: (UTA) Current Grade: UTA Name of school: Dance [employer] psychotherap[employer] Contact  person: UTA  Risk to self with the past 6 months Suicidal Ideation: Yes-Currently Present(Per IVC. ) Has patient been a risk to self within the past 6 months prior to admission? : Yes(Per IVC. ) Suicidal Intent: Yes-Currently Present Has patient had any suicidal intent within  the past 6 months prior to admission? : Yes Is patient at risk for suicide?: Yes Suicidal Plan?: Yes-Currently Present(Per IVC. ) Has patient had any suicidal plan within the past 6 months prior to admission? : Yes Specify Current Suicidal Plan: Per IVC, pt wanted to jump off a bridge, shoot himself. Threatened 'suicide by cop' when encountered. Access to Means: Yes Specify Access to Suicidal Means: Pt has access to bridges, per chart pt was going to get a gun.  What has been your use of drugs/alcohol within the last 12 months?: UDS is pending.  Previous Attempts/Gestures: (UTA) How many times?: (UTA) Other Self Harm Risks: UTA Triggers for Past Attempts: Unknown Intentional Self Injurious Behavior: (UTA) Family Suicide History: Unable to assess Recent stressful life event(s): (UTA) Persecutory voices/beliefs?: Yes Depression: Yes Depression Symptoms: Feeling worthless/self pity, Guilt, Isolating, Fatigue, Loss of interest in usual pleasures, Feeling angry/irritable Substance abuse history and/or treatment for substance abuse?: (UTA) Suicide prevention information given to non-admitted patients: (UTA)  Risk to Others within the past 6 months Homicidal Ideation: (UTA) Does patient have any lifetime risk of violence toward others beyond the six months prior to admission? : (UTA) Thoughts of Harm to Others: (UTA) Current Homicidal Intent: (UTA) Current Homicidal Plan: (UTA) Access to Homicidal Means: (UTA) Identified Victim: UTA History of harm to others?: (UTA) Assessment of Violence: (UTA) Violent Behavior Description: UTA Does patient have access to weapons?: (UTA) Criminal Charges Pending?: (UTA) Does patient have a court date: (UTA) Is patient on probation?: (UTA)  Psychosis Hallucinations: (UTA) Delusions: (UTA)  Mental Status Report Appearance/Hygiene: In scrubs, Bizarre Eye Contact: Poor Motor Activity: Unremarkable Speech: Soft Level of Consciousness:  Quiet/awake Mood: Depressed, Helpless, Preoccupied Affect: Unable to Assess Thought Processes: Circumstantial Judgement: Impaired Orientation: Unable to assess Obsessive Compulsive Thoughts/Behaviors: None  Cognitive Functioning Concentration: Poor Memory: Recent Impaired IQ: Average Insight: Poor Impulse Control: Poor Appetite: (UTA) Sleep: Unable to Assess Vegetative Symptoms: Unable to Assess  ADLScreening [facility] Assessment Services) Patient's cognitive ability adequate to safely complete daily activities?: Yes Patient able to express need for assistance with ADLs?: Yes Independently performs ADLs?: Yes (appropriate for developmental age)  Prior Inpatient Therapy Prior Inpatient Therapy: (UTA) Prior Therapy Dates: UTA Prior Therapy Facilty/Provider(s): UTA Reason for Treatment: UTA  Prior Outpatient Therapy Prior Outpatient Therapy: (UTA) Prior Therapy Dates: UTA Prior Therapy Facilty/Provider(s): UTA Reason for Treatment: UTA Does patient have an ACCT team?: (UTA) Does patient have Intensive In-House Services?  : (UTA) Does patient have [facility] services? : (UTA) Does patient have P4CC services?: (UTA)  ADL Screening (condition at time of admission) Patient's cognitive ability adequate to safely complete daily activities?: Yes Is the patient deaf or have difficulty hearing?: No Does the patient have difficulty seeing, even when wearing glasses/contacts?: (UTA) Does the patient have difficulty concentrating, remembering, or making decisions?: Yes Patient able to express need for assistance with ADLs?: Yes Does the patient have difficulty dressing or bathing?: No Independently performs ADLs?: Yes (appropriate for developmental age) Does the patient have difficulty walking or climbing stairs?: (UTA) Weakness of Legs: (UTA) Weakness of Arms/Hands: (UTA)       Abuse/Neglect Assessment (Assessment to be complete while patient is alone) Abuse/Neglect Assessment Can Be  Completed:  Unable to assess, patient is non-responsive or altered mental status     Advance Directives (For Healthcare) Does Patient Have a Medical Advance Directive?: (UTA)    Additional Information 1:1 In Past 12 Months?: No CIRT Risk: No Elopement Risk: No Does patient have medical clearance?: Yes     Disposition: Nira Conn, NP recommends inpatient treatment. Disposition discussed with Dr. Blinda Leatherwood and Leslie Andrea, RN. TTS to seek placement.    Disposition Initial Assessment Completed for this Encounter: Yes Disposition of Patient: Inpatient treatment program Type of inpatient treatment program: Adult  On Site Evaluation by:  Holly Bodily. Malvina Schadler, MS, LPC, CRC. Reviewed with Physician:  Dr. Blinda Leatherwood and Nira Conn, NP.   Redmond Pulling 07/11/2017 3:49 AM   Redmond Pulling, MS, [facility], [facility] Triage Specialist [phone]

## 2017-07-12 DIAGNOSIS — R45851 Suicidal ideations: Secondary | ICD-10-CM

## 2017-07-12 DIAGNOSIS — Z915 Personal history of self-harm: Secondary | ICD-10-CM

## 2017-07-12 DIAGNOSIS — F17210 Nicotine dependence, cigarettes, uncomplicated: Secondary | ICD-10-CM

## 2017-07-12 DIAGNOSIS — R450 Nervousness: Secondary | ICD-10-CM

## 2017-07-12 DIAGNOSIS — F315 Bipolar disorder, current episode depressed, severe, with psychotic features: Secondary | ICD-10-CM

## 2017-07-12 DIAGNOSIS — Z818 Family history of other mental and behavioral disorders: Secondary | ICD-10-CM

## 2017-07-12 DIAGNOSIS — F419 Anxiety disorder, unspecified: Secondary | ICD-10-CM

## 2017-07-12 MED ORDER — RISPERIDONE 1 MG PO TABS
1.0000 mg | ORAL_TABLET | Freq: Two times a day (BID) | ORAL | Status: DC
  Administered 2017-07-12 – 2017-07-13 (×2): 1 mg via ORAL
  Filled 2017-07-12 (×7): qty 1

## 2017-07-12 MED ORDER — ZIPRASIDONE MESYLATE 20 MG IM SOLR: 20.0000 mg | Freq: Two times a day (BID) | INTRAMUSCULAR | Status: DC | PRN

## 2017-07-12 MED ORDER — LORAZEPAM 1 MG PO TABS: 1.0000 mg | ORAL_TABLET | Freq: Two times a day (BID) | ORAL | Status: DC | PRN

## 2017-07-12 NOTE — Progress Notes (Signed)
Adult Psychoeducational Group Note  Date:  07/12/2017 Time:  8:45 PM  Group Topic/Focus:  Wrap-Up Group:   The focus of this group is to help patients review their daily goal of treatment and discuss progress on daily workbooks.  Participation Level:  Active  Participation Quality:  Appropriate  Affect:  Appropriate  Cognitive:  Appropriate  Insight: Appropriate  Engagement in Group:  Engaged  Modes of Intervention:  Discussion  Additional Comments:  The patient expressed  that he rates today a 9 and attended all groups  Octavio Mannshigpen, Laure Leone Lee 07/12/2017, 8:45 PM

## 2017-07-12 NOTE — B[facility] Suicide Risk Assessment (Signed)
[facility] INPATIENT:  Family/Significant Other Suicide Prevention Education  Suicide Prevention Education:  Education Completed; Antonio Wright, mother, [phone], has been identified by the patient as the family member/significant other with whom the patient will be residing, and identified as the person(s) who will aid the patient in the event of a mental health crisis (suicidal ideations/suicide attempt).  With written consent from the patient, the family member/significant other has been provided the following suicide prevention education, prior to the and/or following the discharge of the patient.  The suicide prevention education provided includes the following:  Suicide risk factors  Suicide prevention and interventions  National Suicide Hotline telephone number  [facility] assessment telephone number  [facility] Emergency Assistance 911  [facility] and/or Residential Mobile Crisis Unit telephone number  Request made of family/significant other to:  Remove weapons (e.g., guns, rifles, knives), all items previously/currently identified as safety concern.  No guns in the home, per [place].  Remove drugs/medications (over-the-counter, prescriptions, illicit drugs), all items previously/currently identified as a safety concern. No extra medications in home, per [place].  The family member/significant other verbalizes understanding of the suicide prevention education information provided.  The family member/significant other agrees to remove the items of safety concern listed above.  Antonio Wright reports pt has a lot of stress, particularly with his kids' mother.  He does not get to see his kids as much as he wants.  When that happens, "he talks about hurting himself but he isn't going to do it."  She reports pt has said things like this before and she does not have concerns that he would ever actually harm himself.    Antonio Wright, Antonio Oplinger Jon, LCSW 07/12/2017, 4:09 PM

## 2017-07-12 NOTE — Progress Notes (Signed)
Recreation Therapy Notes  Date: 07/12/17 Time: 1000 Location: 500 Hall Dayroom  Group Topic: Coping Skills  Goal Area(s) Addresses:  Patient will be able to identify positive coping skills. Patient will be able to identify benefits of positive coping skills. Patient will identify benefits of using coping skills post d/c.  Behavioral Response:  Engaged  Intervention: Worksheet, pencil  Activity: OrthoptistWeb Design.  Patients will identify things they feel have gotten them "stuck".  Patients will then identify coping skills they can use to help them deal with the situations they have been dealing with.    Education: PharmacologistCoping Skills, Building control surveyorDischarge Planning.   Education Outcome: Acknowledges understanding/In group clarification offered/Needs additional education.   Clinical Observations/Feedback: Pt was flat.  Pt expressed he was dealing with depression, anger, hopelessness and separation anxiety.  Pt identified his coping skills as resting, jogging/training (boxing), video games, speaking with others, music and activities with his children.     Caroll RancherMarjette Koreen Lizaola, LRT/CTRS     Caroll RancherLindsay, Azrielle Springsteen A 07/12/2017 11:44 AM

## 2017-07-12 NOTE — [facility] Suicide Risk Assessment (Signed)
[facility]   Nursing information obtained from:  Patient Demographic factors:  Male Current Mental Status:  Self-harm thoughts Loss Factors:  Loss of significant relationship, Legal issues, Financial problems / change in socioeconomic status Historical Factors:  NA Risk Reduction Factors:  Responsible for children under 30 years of age  Total Time spent with patient: 1 hour Principal Problem: Bipolar I disorder, current or most recent episode depressed, with psychotic features (HCC) Diagnosis:   Patient Active Problem List   Diagnosis Date Noted  . Bipolar I disorder, current or most recent episode depressed, with psychotic features (HCC) [F31.5] 07/11/2017  . Forearm laceration, right, initial encounter [S51.811A] 02/21/2017  . Substance induced mood disorder (HCC) [F19.94] 12/03/2014  . MDMA abuse (HCC) [F16.10] 12/03/2014   Subjective Data: see H&P for details  Continued Clinical Symptoms:  Alcohol Use Disorder Identification Test Final Score (AUDIT): 1 The "Alcohol Use Disorders Identification Test", Guidelines for Use in Primary Care, Second Edition.  World Science writerHealth Organization [facility](WHO). Score between 0-7:  no or low risk or alcohol related problems. Score between 8-15:  moderate risk of alcohol related problems. Score between 16-19:  high risk of alcohol related problems. Score 20 or above:  warrants further diagnostic evaluation for alcohol dependence and treatment.   CLINICAL FACTORS:   Severe Anxiety and/or Agitation Bipolar Disorder:   Depressive phase Currently Psychotic Unstable or Poor Therapeutic Relationship   Musculoskeletal:  Psychiatric Specialty Exam: Physical Exam  ROS  Blood pressure (!) 106/92, pulse 84, temperature 98.1 F (36.7 C), temperature source Oral, resp. rate 16, height 5' 11.5" (1.816 m), weight 70.3 kg (155 lb).Body mass index is 21.32 kg/m.    COGNITIVE FEATURES THAT CONTRIBUTE TO RISK:  None    SUICIDE RISK:    Moderate:  Frequent suicidal ideation with limited intensity, and duration, some specificity in terms of plans, no associated intent, good self-control, limited dysphoria/symptomatology, some risk factors present, and identifiable protective factors, including available and accessible social support.  PLAN OF CARE: see H&P for details  I certify that inpatient services furnished can reasonably be expected to improve the patient's condition.   Micheal Likenshristopher T Gerard Bonus, MD 07/12/2017, 5:41 PM

## 2017-07-12 NOTE — Progress Notes (Signed)
DAR NOTE: Patient presents with anxious affect and depressed mood.  Denies  auditory and visual hallucinations.  Described energy level as low and concentration as good.  Rates depression at 6, hopelessness at 9, and anxiety at 7.  Maintained on routine safety checks.  Medications given as prescribed.  Support and encouragement offered as needed.  Attended group and participated.  States goal for today is "to feel happier."  Patient visible in milieu interacting with peer.  Vistaril 25 mg given for complain of anxiety with good effect.
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
## 2017-07-13 MED ORDER — RISPERIDONE 2 MG PO TABS
2.0000 mg | ORAL_TABLET | Freq: Every day | ORAL | Status: DC
  Administered 2017-07-13: 2 mg via ORAL
  Filled 2017-07-13 (×2): qty 1

## 2017-07-13 MED ORDER — RISPERIDONE 1 MG PO TABS
1.0000 mg | ORAL_TABLET | Freq: Every day | ORAL | Status: DC
  Administered 2017-07-14: 1 mg via ORAL
  Filled 2017-07-13 (×2): qty 1

## 2017-07-13 MED ORDER — RISPERIDONE 2 MG PO TBDP
2.0000 mg | ORAL_TABLET | Freq: Three times a day (TID) | ORAL | Status: DC | PRN
Start: 2017-07-13 — End: 2017-07-18
  Administered 2017-07-13 – 2017-07-16 (×3): 2 mg via ORAL
  Filled 2017-07-13 (×3): qty 2

## 2017-07-13 NOTE — Progress Notes (Signed)
Patient ID: Antonio Wright, male   DOB: Oct 23, 1985, 32 y.o.   MRN: [id number]  D: patient calm and cooperative on the unit. Pt has been visible in the dayroom watching TV and interacting well with peers. Patient mood and affect appeared depressed and flat. Pt denies SI/HI/AVH and pain. Pt attended evening wrap group and engaged in discussions.No behavioral issues noted.  A: Support and encouragement offered as needed to express needs. Medications administered as prescribed.  R: Patient is safe and cooperative on unit. Will continue to monitor  for safety and stability.

## 2017-07-13 NOTE — Progress Notes (Addendum)
Recreation Therapy Notes  INPATIENT RECREATION THERAPY ASSESSMENT  Patient Details Name: Antonio Wright MRN: [id number] DOB: Sep 10, 1985 Today's Date: 07/13/2017       Information Obtained From: Patient  Able to Participate in Assessment/Interview: Yes  Patient Presentation: Alert, Oriented  Reason for Admission (Per Patient): Suicidal Ideation  Patient Stressors: Family, Other (Comment)(Financial, loss, hopeless)  Pt stated he and his partner can't agree on how to raise their kids.  Coping Skills:   Self-Injury, Sports, Arguments, Aggression, Exercise, Meditate, Deep Breathing, Substance Abuse, Impulsivity, Talk, Hot Bath/Shower  Leisure Interests (2+):  Games - Video games, Sports - Basketball, Social - Family  Frequency of Recreation/Participation: Other (Comment)(Daily)  Awareness of Community Resources:  Yes  Community Resources:  Park, Public affairs consultantestaurants  Current Use: Yes  Expressed Interest in State Street CorporationCommunity Resource Information: No  [place] EnergyCounty of Residence[place]  Patient Main Form of Transportation: Therapist, musicublic Transportation  Patient Strengths:  Strong minded  Patient Identified Areas of Improvement:  Understanding of others; caring about others  Patient Goal for Hospitalization:  "Better learn self and anger"  Current SI (including self-harm):  No  Current HI:  No  Current AVH: No  Staff Intervention Plan: Group Attendance  Consent to Intern Participation: N/A   Antonio Wright, LRT/CTRS  Lillia AbedLindsay, Khiree Bukhari A 07/13/2017, 2:23 PM

## 2017-07-13 NOTE — Tx Team (Signed)
Interdisciplinary Treatment and Diagnostic Plan Update  07/13/2017 Time of Session: 11:29 AM  Antonio Wright MRN: [id number]  Principal Diagnosis: Bipolar I disorder, current or most recent episode depressed, with psychotic features (HCC)  Secondary Diagnoses: Principal Problem:   Bipolar I disorder, current or most recent episode depressed, with psychotic features (HCC)   Current Medications:  Current Facility-Administered Medications  Medication Dose Route Frequency Provider Last Rate Last Dose  . acetaminophen (TYLENOL) tablet 650 mg  650 mg Oral Q6H PRN Laveda AbbeParks, Laurie Britton, NP   650 mg at 07/12/17 1648  . alum & mag hydroxide-simeth (MAALOX/MYLANTA) 200-200-20 MG/5ML suspension 30 mL  30 mL Oral Q4H PRN Laveda AbbeParks, Laurie Britton, NP      . hydrOXYzine (ATARAX/VISTARIL) tablet 25 mg  25 mg Oral TID PRN Laveda AbbeParks, Laurie Britton, NP   25 mg at 07/12/17 1130  . LORazepam (ATIVAN) tablet 1 mg  1 mg Oral Q12H PRN Micheal Likensainville, Christopher T, MD       And  . ziprasidone (GEODON) injection 20 mg  20 mg Intramuscular Q12H PRN Micheal Likensainville, Christopher T, MD      . magnesium hydroxide (MILK OF MAGNESIA) suspension 30 mL  30 mL Oral Daily PRN Laveda AbbeParks, Laurie Britton, NP      . nicotine (NICODERM CQ - dosed in mg/24 hours) patch 21 mg  21 mg Transdermal Daily Laveda AbbeParks, Laurie Britton, NP   21 mg at 07/13/17 0843  . risperiDONE (RISPERDAL) tablet 1 mg  1 mg Oral BID Micheal Likensainville, Christopher T, MD   1 mg at 07/13/17 0842  . traZODone (DESYREL) tablet 50 mg  50 mg Oral QHS PRN Laveda AbbeParks, Laurie Britton, NP   50 mg at 07/12/17 2105    PTA Medications: Medications Prior to Admission  Medication Sig Dispense Refill Last Dose  . aspirin EC 325 MG tablet Take 1 tablet (325 mg total) by mouth daily. (Patient not taking: Reported on 03/29/2017) 30 tablet 0 Not Taking at Unknown time  . docusate sodium (COLACE) 100 MG capsule Take 1 capsule (100 mg total) by mouth 2 (two) times daily. (Patient not taking: Reported on  03/29/2017) 10 capsule 0 Not Taking at Unknown time  . docusate sodium (COLACE) 100 MG capsule Take 1 capsule (100 mg total) by mouth 2 (two) times daily. (Patient not taking: Reported on 03/29/2017) 10 capsule 0 Not Taking at Unknown time  . HYDROcodone-acetaminophen (NORCO/VICODIN) 5-325 MG tablet Take 1 tablet by mouth every 4 (four) hours as needed for moderate pain. (Patient not taking: Reported on 03/29/2017) 10 tablet 0 Not Taking at Unknown time  . methocarbamol (ROBAXIN) 500 MG tablet Take 1 tablet (500 mg total) by mouth 3 (three) times daily. (Patient not taking: Reported on 03/29/2017) 30 tablet 0 Not Taking at Unknown time    Treatment Modalities: Medication Management, Group therapy, Case management,  1 to 1 session with clinician, Psychoeducation, Recreational therapy.  Patient Stressors: Paediatric nurseinancial difficulties Legal issue Marital or family conflict  Patient Strengths: Wellsite geologistCommunication skills General fund of knowledge Physical Health   Physician Treatment Plan for Primary Diagnosis: Bipolar I disorder, current or most recent episode depressed, with psychotic features (HCC) Long Term Goal(s): Improvement in symptoms so as ready for discharge  Short Term Goals: Ability to demonstrate self-control will improve Ability to identify triggers associated with substance abuse/mental health issues will improve  Medication Management: Evaluate patient's response, side effects, and tolerance of medication regimen.  Therapeutic Interventions: 1 to 1 sessions, Unit Group sessions and Medication administration.  Evaluation of  Outcomes: Progressing  Physician Treatment Plan for Secondary Diagnosis: Principal Problem:   Bipolar I disorder, current or most recent episode depressed, with psychotic features (HCC)  Long Term Goal(s): Improvement in symptoms so as ready for discharge  Short Term Goals: Ability to demonstrate self-control will improve Ability to identify triggers associated  with substance abuse/mental health issues will improve  Medication Management: Evaluate patient's response, side effects, and tolerance of medication regimen.  Therapeutic Interventions: 1 to 1 sessions, Unit Group sessions and Medication administration.  Evaluation of Outcomes: Progressing   RN Treatment Plan for Primary Diagnosis: Bipolar I disorder, current or most recent episode depressed, with psychotic features (HCC) Long Term Goal(s): Knowledge of disease and therapeutic regimen to maintain health will improve  Short Term Goals: Ability to remain free from injury will improve, Ability to demonstrate self-control, Ability to disclose and discuss suicidal ideas, Ability to identify and develop effective coping behaviors will improve and Compliance with prescribed medications will improve  Medication Management: RN will administer medications as ordered by provider, will assess and evaluate patient's response and provide education to patient for prescribed medication. RN will report any adverse and/or side effects to prescribing provider.  Therapeutic Interventions: 1 on 1 counseling sessions, Psychoeducation, Medication administration, Evaluate responses to treatment, Monitor vital signs and CBGs as ordered, Perform/monitor CIWA, COWS, AIMS and Fall Risk screenings as ordered, Perform wound care treatments as ordered.  Evaluation of Outcomes: Progressing   LCSW Treatment Plan for Primary Diagnosis: Bipolar I disorder, current or most recent episode depressed, with psychotic features (HCC) Long Term Goal(s): Safe transition to appropriate next level of care at discharge, Engage patient in therapeutic group addressing interpersonal concerns.  Short Term Goals: Engage patient in aftercare planning with referrals and resources, Increase emotional regulation, Facilitate acceptance of mental health diagnosis and concerns, Identify triggers associated with mental health/substance abuse issues  and Increase skills for wellness and recovery  Therapeutic Interventions: Assess for all discharge needs, 1 to 1 time with Social worker, Explore available resources and support systems, Assess for adequacy in community support network, Educate family and significant other(s) on suicide prevention, Complete Psychosocial Assessment, Interpersonal group therapy.  Evaluation of Outcomes: Progressing   Progress in Treatment: Attending groups: Yes Participating in groups: Yes Taking medication as prescribed: Yes Toleration of medication: Yes, no side effects reported at this time Family/Significant other contact made: Ndrew Creason, mother, [phone] Patient understands diagnosis: Yes AEB asking for help with understanding his diagnosis and controlling his anger  Discussing patient identified problems/goals with staff: Yes Medical problems stabilized or resolved: Yes Denies suicidal/homicidal ideation: Yes Issues/concerns per patient self-inventory: None Other: N/A  New problem(s) identified: None identified at this time.   New Short Term/Long Term Goal(s): "To control my anger, better understand myself, and learn that there is only one me"   Discharge Plan or Barriers: Pt will return home with his mother and follow up at [facility]   Reason for Continuation of Hospitalization:  Depression Medication stabilization Suicidal ideation   Estimated Length of Stay: 07/18/17  Attendees: Patient: Antonio Wright 07/13/2017  11:29 AM  Physician: Jolyne Loa, MD 07/13/2017  11:29 AM  Nursing: Estella Husk, RN 07/13/2017  11:29 AM  RN Care Manager: Onnie Boer, RN 07/13/2017  11:29 AM  Social Worker: Richelle Ito, LCSW; Melba Coon, Social Work Intern 07/13/2017  11:29 AM  Recreational Therapist: Caroll Rancher, LRT 07/13/2017  11:29 AM  Other: Tomasita Morrow, P4CC 07/13/2017  11:29 AM  Other:  07/13/2017  11:29 AM  Other: 07/13/2017  11:29 AM    Scribe for Treatment  Team: Aram Beecham, Student-Social Work 07/13/2017 11:29 AM

## 2017-07-13 NOTE — [facility] Group Notes (Signed)
LCSW Group Therapy Note  07/13/2017 1:15pm  Type of Therapy/Topic:  Group Therapy:  Balance in Life  Participation Level:  Active  Description of Group:    This group will address the concept of balance and how it feels and looks when one is unbalanced. Patients will be encouraged to process areas in their lives that are out of balance and identify reasons for remaining unbalanced. Facilitators will guide patients in utilizing problem-solving interventions to address and correct the stressor making their life unbalanced. Understanding and applying boundaries will be explored and addressed for obtaining and maintaining a balanced life. Patients will be encouraged to explore ways to assertively make their unbalanced needs known to significant others in their lives, using other group members and facilitator for support and feedback.  Therapeutic Goals: 1. Patient will identify two or more emotions or situations they have that consume much of in their lives. 2. Patient will identify signs/triggers that life has become out of balance:  3. Patient will identify two ways to set boundaries in order to achieve balance in their lives:  4. Patient will demonstrate ability to communicate their needs through discussion and/or role plays  Summary of Patient Progress:   Antonio Wright came to group and remained there the entire time.  Today he felt unbalanced because he is anxious.  When he feels this way he experiences racing thoughts and an empty feeling in his stomach.  He also feels worried and sad but is not sure why.  He copes with these feelings by isolating himself.  He believes that being able to talk about his feelings and share with others is helping him to find his balance and work through the things in his life.    Therapeutic Modalities:   Cognitive Behavioral Therapy Solution-Focused Therapy Assertiveness Training  [name] Brilee Port, Student-Social Work 07/13/2017 1:29 PM

## 2017-07-13 NOTE — [facility] Group Notes (Signed)
[facility] Group Notes:  (Nursing/MHT/Case Management/Adjunct)  Date:  07/13/2017  Time:  6:38 PM Type of Therapy:  Psychoeducational Skills  Participation Level:  Active  Participation Quality:  Appropriate  Affect:  Appropriate  Cognitive:  Appropriate  Insight:  Appropriate  Engagement in Group:  Engaged  Modes of Intervention:  Problem-solving  Summary of Progress/Problems: Pt shared their experiences with peers.   Bethann PunchesJane O Matayah Reyburn 07/13/2017, 6:38 PM

## 2017-07-13 NOTE — Progress Notes (Signed)
[facility] MD Progress Note  07/13/2017 3:31 PM Antonio Wright  MRN:  [id number] Subjective:    Antonio Wright is a 32 y/o M with history of treatment for depression and substance use who was admitted on IVC after patient had contacted police and reported SI with plan to jump from a bridge; Antonio Wright was agitated on the scene and was apologetic but then also kicking the police vehicle. Antonio Wright had also reported history of feeling that his body was taken over by an entity called "Dillie" who would react when Antonio Wright was in times of emotional distress. Antonio Wright was started on risperdal to address mood and psychotic symptoms.  Today upon evaluation, Antonio Wright shares, "I feel like I haven't been expressing my feelings to myself - I feel like I've been fighting myself. Dillie has been talking to me, and I want him to be gone." Antonio Wright reports AH of hearing Dillie, and Antonio Wright reports that he was being commanded to jump through the window. He denies SI/HI/VH. He is able to contract for safety while in the hospital. Discussed with patient about increasing dose of risperdal in the evening as well as adding PRN risperdal for ongoing symptoms of psychosis during the daytime, and Antonio Wright was in agreement.   Principal Problem: Bipolar I disorder, current or most recent episode depressed, with psychotic features [facility]) Diagnosis:   Patient Active Problem List   Diagnosis Date Noted  . Bipolar I disorder, current or most recent episode depressed, with psychotic features (HCC) [F31.5] 07/11/2017  . Forearm laceration, right, initial encounter [S51.811A] 02/21/2017  . Substance induced mood disorder (HCC) [F19.94] 12/03/2014  . 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 Chronic back pain     Past Surgical History:  Procedure Laterality Date  . I&D EXTREMITY Right 02/21/2017   Procedure: Wound Exploration Right Forearm  And Repair As Needed;  Surgeon: Bradly Bienenstock, MD;  Location: [facility] OR;  Service: Orthopedics;  Laterality: Right;   Family History:  Family History  Problem Relation Age of Onset  . Diabetes Father   . Diabetes Other    Family Psychiatric  History: see H&P Social History:  Social History   Substance and Sexual Activity  Alcohol Use Yes   Comment: occassionally     Social History   Substance and Sexual Activity  Drug Use Yes  . Types: Marijuana   Comment: occ    Social History   Socioeconomic History  . Marital status: Single    Spouse name: None  . Number of children: None  . Years of education: None  . Highest education level: None  Social Needs  . Financial resource strain: None  . Food insecurity - worry: None  . Food insecurity - inability: None  . Transportation needs - medical: None  . Transportation needs - non-medical: None  Occupational History  . None  Tobacco Use  . Smoking status: Current Every Day Smoker    Packs/day: 0.50    Types: Cigarettes  . Smokeless tobacco: Never Used  Substance and Sexual Activity  . Alcohol use: Yes    Comment: occassionally  . Drug use: Yes    Types: Marijuana    Comment: occ  . Sexual activity: Yes  Other Topics Concern  . None  Social History Narrative  . None   Additional Social History:  Sleep: Good  Appetite:  Good  Current Medications: Current Facility-Administered Medications  Medication Dose Route Frequency Provider Last Rate Last Dose  . acetaminophen (TYLENOL) tablet 650 mg  650 mg Oral Q6H PRN Laveda AbbeParks, Laurie Britton, NP   650 mg at 07/13/17 1448  . alum & mag hydroxide-simeth (MAALOX/MYLANTA) 200-200-20 MG/5ML suspension 30 mL  30 mL Oral Q4H PRN Laveda AbbeParks, Laurie Britton, NP      . hydrOXYzine (ATARAX/VISTARIL) tablet 25 mg  25 mg Oral TID PRN Laveda AbbeParks, Laurie Britton, NP   25 mg at 07/12/17 1130  . LORazepam (ATIVAN) tablet 1 mg  1 mg Oral Q12H PRN Micheal Likensainville, Andrus Sharp  T, MD       And  . ziprasidone (GEODON) injection 20 mg  20 mg Intramuscular Q12H PRN Micheal Likensainville, Batoul Limes T, MD      . magnesium hydroxide (MILK OF MAGNESIA) suspension 30 mL  30 mL Oral Daily PRN Laveda AbbeParks, Laurie Britton, NP      . nicotine (NICODERM CQ - dosed in mg/24 hours) patch 21 mg  21 mg Transdermal Daily Laveda AbbeParks, Laurie Britton, NP   21 mg at 07/13/17 0843  . risperiDONE (RISPERDAL M-TABS) disintegrating tablet 2 mg  2 mg Oral Q8H PRN Micheal Likensainville, Sameena Artus T, MD   2 mg at 07/13/17 1448  . [START ON 07/14/2017] risperiDONE (RISPERDAL) tablet 1 mg  1 mg Oral Daily Nishika Parkhurst T, MD       And  . risperiDONE (RISPERDAL) tablet 2 mg  2 mg Oral QHS Micheal Likensainville, Edit Ricciardelli T, MD      . traZODone (DESYREL) tablet 50 mg  50 mg Oral QHS PRN Laveda AbbeParks, Laurie Britton, NP   50 mg at 07/12/17 2105    Lab Results: No results found for this or any previous visit (from the past 48 hour(s)).  Blood Alcohol level:  Lab Results  Component Value Date   ETH <10 07/11/2017   ETH <5 12/02/2014    Metabolic Disorder Labs: No results found for: HGBA1C, MPG No results found for: PROLACTIN No results found for: CHOL, TRIG, HDL, CHOLHDL, VLDL, LDLCALC  Physical Findings: AIMS: Facial and Oral Movements Muscles of Facial Expression: None, normal Lips and Perioral Area: None, normal Jaw: None, normal Tongue: None, normal,Extremity Movements Upper (arms, wrists, hands, fingers): None, normal Lower (legs, knees, ankles, toes): None, normal, Trunk Movements Neck, shoulders, hips: None, normal, Overall Severity Severity of abnormal movements (highest score from questions above): None, normal Incapacitation due to abnormal movements: None, normal Patient's awareness of abnormal movements (rate only patient's report): No Awareness, Dental Status Current problems with teeth and/or dentures?: No Does patient usually wear dentures?: No  CIWA:    COWS:     Musculoskeletal: Strength & Muscle Tone:  within normal limits Gait & Station: normal Patient leans: N/A  Psychiatric Specialty Exam: Physical Exam  Nursing note and vitals reviewed.   Review of Systems  Constitutional: Negative for chills and fever.  Respiratory: Negative for cough and shortness of breath.   Cardiovascular: Negative for chest pain.  Gastrointestinal: Negative for abdominal pain, heartburn, nausea and vomiting.  Psychiatric/Behavioral: Positive for depression, hallucinations and suicidal ideas. The patient is nervous/anxious. The patient does not have insomnia.     Blood pressure 125/78, pulse 80, temperature 97.9 F (36.6 C), temperature source Oral, resp. rate 16, height 5' 11.5" (1.816 m), weight 70.3 kg (155 lb).Body mass index is 21.32 kg/m.  General Appearance: Casual  Eye Contact:  Good  Speech:  Clear and Coherent and Normal Rate  Volume:  Normal  Mood:  Anxious and Depressed  Affect:  Blunt and Congruent  Thought Process:  Coherent, Goal Directed and Descriptions of Associations: Loose  Orientation:  Full (Time, Place, and Person)  Thought Content:  Delusions, Hallucinations: Auditory and Ideas of Reference:   Paranoia Delusions  Suicidal Thoughts:  No  Homicidal Thoughts:  No  Memory:  Immediate;   Good Recent;   Good Remote;   Good  Judgement:  Fair  Insight:  Fair  Psychomotor Activity:  Normal  Concentration:  Concentration: Fair  Recall:  Fair  Fund of Knowledge:  Fair  Language:  Fair  Akathisia:  No  Handed:    AIMS (if indicated):     Assets:  Resilience  ADL's:  Intact  Cognition:  WNL  Sleep:  Number of Hours: 6.5   Treatment Plan Summary: Daily contact with patient to assess and evaluate symptoms and progress in treatment and Medication management   - Bipolar I, current episode depressed with psychotic features   - Change risperdal 1mg  BID to risperdal 1mg  qAM + 2mg  po qhs   - Start risperdal M-Tab 2mg  po q8h prn psychosis  - anxiety   - Continue atarax 25mg  po q8h  prn anxiety  - insomnia   - continue trazodone 50mg  po qhs prn insomnia  -Encourage participation in groups and the therapeutic milieu  -Discharge planning will be ongoing   Micheal Likens, MD 07/13/2017, 3:31 PM

## 2017-07-13 NOTE — Progress Notes (Signed)
Patient states that he had a great day since he had a good talk with his kids. He also states that he had a good family visit this evening as well. As for the theme of the day, his personal development will involve taking more responsibility for his anger.

## 2017-07-13 NOTE — Progress Notes (Signed)
D: Pt presents with depressed affect and mood when assessed. States "I'm just trying to do better to control my anger, I know that Dillie is not real". Denies SI, HI, AVH and pain. Rates his depression 6/10, hopelessness 5/10 and anxiety 7/10. Reports good appetite with fair sleep and poor concentration level. States "I appreciate all y'all help, this is my first time being in a place like this".  A: All medications given as ordered with verbal education and effects monitored. Emotional support and availability provided to pt. Encouraged pt to voice concerns. Safety checks continues at Q 15 minutes intervals without self harm gestures or outburst to note thus far.  R: Pt receptive to care. Compliant with medications, denies adverse drug reactions when assessed. Brightens up on interactions. Attended scheduled unit groups and participated in discussion. Tolerates all PO intake well. POC maintained for safety and mood stability.

## 2017-07-13 NOTE — Progress Notes (Signed)
Recreation Therapy Notes    Date: 07/13/17 Time: 1000 Location: 500 Hall Dayroom  Group Topic: Anger Management  Goal Area(s) Addresses:  Patient will identify triggers for anger.  Patient will identify physical reaction to anger.   Patient will identify benefit of using coping skills when angry.  Behavioral Response: Engaged  Intervention: Worksheet, pencils  Activity: Introduction to Stress Management.  Patients were to identify the situations/topics/people that lead to anger; how they act when they are angry and problems they have run into because of their anger.    Education: Anger Management, Discharge Planning   Education Outcome: Acknowledges education/In group clarification offered/Needs additional education.   Clinical Observations/Feedback:  Pt stated his triggers are "arguing with my partner about methods of raising our kids" and unfair calls when playing sports.  Pt explained his reaction to anger is to punch things (tv, video games, laptops), yell/argue and leave/jog.  Pt stated he has suffered losses like property, relationships and his brother because they don't speak.     Caroll RancherMarjette Holt Woolbright, LRT/CTRS       Caroll RancherLindsay, Caron Ode A 07/13/2017 12:19 PM

## 2017-07-14 MED ORDER — RISPERIDONE 2 MG PO TABS
2.0000 mg | ORAL_TABLET | Freq: Two times a day (BID) | ORAL | Status: DC
  Administered 2017-07-14 – 2017-07-18 (×8): 2 mg via ORAL
  Filled 2017-07-14 (×10): qty 1

## 2017-07-14 NOTE — [facility] Group Notes (Signed)
LCSW Group Therapy Note   07/14/2017 1:15pm   Type of Therapy and Topic:  Group Therapy:  Trust and Honesty  Participation Level:  Active  Description of Group:    In this group patients will be asked to explore the value of being honest.  Patients will be guided to discuss their thoughts, feelings, and behaviors related to honesty and trusting in others. Patients will process together how trust and honesty relate to forming relationships with peers, family members, and self. Each patient will be challenged to identify and express feelings of being vulnerable. Patients will discuss reasons why people are dishonest and identify alternative outcomes if one was truthful (to self or others). This group will be process-oriented, with patients participating in exploration of their own experiences, giving and receiving support, and processing challenge from other group members.   Therapeutic Goals: 1. Patient will identify why honesty is important to relationships and how honesty overall affects relationships.  2. Patient will identify a situation where they lied or were lied too and the  feelings, thought process, and behaviors surrounding the situation 3. Patient will identify the meaning of being vulnerable, how that feels, and how that correlates to being honest with self and others. 4. Patient will identify situations where they could have told the truth, but instead lied and explain reasons of dishonesty.   Summary of Patient Progress  "Honesty is truth, the non-fabricated statement."  Talked about being honest with Dr today by saying that although he wants to leave, he knows he needs to stay longer to get the help he needs while here.  Unlike previous groups, [name] did not become tearful and stayed the entrie time.  Therapeutic Modalities:   Cognitive Behavioral Therapy Solution Focused Therapy Motivational Interviewing Brief Therapy  Ida RogueRodney B Lyndsee Casa, LCSW 07/14/2017 10:16 AM

## 2017-07-14 NOTE — Progress Notes (Signed)
D: Pt denies SI/HI/AVH. Pt is pleasant and cooperative. Pt stated he was feeling better due to him finding out a diagnosis of bipolar and learning to deal with it, pt stated he was anxious about not being able to see his children like he wanted , but he appeared to understand to work on himself so he could be there more in the long run.   A: Pt was offered support and encouragement. Pt was given scheduled medications. Pt was encourage to attend groups. Q 15 minute checks were done for safety.   R:Pt attends groups and interacts well with peers and staff. Pt is taking medication. Pt has no complaints .Pt receptive to treatment and safety maintained on unit.

## 2017-07-14 NOTE — Progress Notes (Signed)
[facility] MD Progress Note  07/14/2017 12:54 PM Antonio Wright  MRN:  [id number] Subjective:    Antonio Wright is a 32 y/o M with history of treatment for depression and substance use who was admitted on IVC after patient had contacted police and reported SI with plan to jump from a bridge; pt was agitated on the scene and was apologetic but then also kicking the police vehicle. Pt had also reported history of feeling that his body was taken over by an entity called "Dillie" who would react when pt was in times of emotional distress. Pt was started on risperdal to address mood and psychotic symptoms. Pt had ongoing depression, anxiety, and experience of interacting with AH during his stay, but he noted incremental improvement of his symptoms with use of risperdal both scheduled and PRN.  Today upon evaluation, pt shares, "I'm doing great today - I had a chance to talk to my kids." Pt shares that he spoke with his significant other over the phone to share updates on his progress and he was able to speak with his children, which he found to be motivating and emotionally uplifting. He notes that his mood has been improving and his anxiety is reducing. He denies SI/HI/AH/VH. He shares regarding Dillie (the entity that speaks to him and has taken over his body), "I haven't heard anything from him, but I spoke to him directly last night and hoped he would hear me that I am the only me and I don't want him to come back." Pt denies any physical complaints. He is sleeping well and his appetite is good. He would like to continue on his current regimen, and we discussed continuing to titrate up on the dose of risperdal, and pt was in agreement. He had no further questions, comments, or concerns.   Principal Problem: Bipolar I disorder, current or most recent episode depressed, with psychotic features [facility]) Diagnosis:   Patient Active Problem List   Diagnosis Date Noted  . Bipolar I disorder, current or most recent  episode depressed, with psychotic features (HCC) [F31.5] 07/11/2017  . Forearm laceration, right, initial encounter [S51.811A] 02/21/2017  . Substance induced mood disorder (HCC) [F19.94] 12/03/2014  . 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 Chronic back pain     Past Surgical History:  Procedure Laterality Date  . I&D EXTREMITY Right 02/21/2017   Procedure: Wound Exploration Right Forearm And Repair As Needed;  Surgeon: Bradly Bienenstock, MD;  Location: [facility] OR;  Service: Orthopedics;  Laterality: Right;   Family History:  Family History  Problem Relation Age of Onset  . Diabetes Father   . Diabetes Other    Family Psychiatric  History: see H&P Social History:  Social History   Substance and Sexual Activity  Alcohol Use Yes   Comment: occassionally     Social History   Substance and Sexual Activity  Drug Use Yes  . Types: Marijuana   Comment: occ    Social History   Socioeconomic History  . Marital status: Single    Spouse name: None  . Number of children: None  . Years of education: None  . Highest education level: None  Social Needs  . Financial resource strain: None  . Food insecurity - worry: None  . Food insecurity - inability: None  . Transportation needs - medical: None  .  Transportation needs - non-medical: None  Occupational History  . None  Tobacco Use  . Smoking status: Current Every Day Smoker    Packs/day: 0.50    Types: Cigarettes  . Smokeless tobacco: Never Used  Substance and Sexual Activity  . Alcohol use: Yes    Comment: occassionally  . Drug use: Yes    Types: Marijuana    Comment: occ  . Sexual activity: Yes  Other Topics Concern  . None  Social History Narrative  . None   Additional Social History:                         Sleep: Good  Appetite:  Good  Current  Medications: Current Facility-Administered Medications  Medication Dose Route Frequency Provider Last Rate Last Dose  . acetaminophen (TYLENOL) tablet 650 mg  650 mg Oral Q6H PRN Laveda Abbe, NP   650 mg at 07/13/17 1448  . alum & mag hydroxide-simeth (MAALOX/MYLANTA) 200-200-20 MG/5ML suspension 30 mL  30 mL Oral Q4H PRN Laveda Abbe, NP      . hydrOXYzine (ATARAX/VISTARIL) tablet 25 mg  25 mg Oral TID PRN Laveda Abbe, NP   25 mg at 07/12/17 1130  . LORazepam (ATIVAN) tablet 1 mg  1 mg Oral Q12H PRN Micheal Likens, MD       And  . ziprasidone (GEODON) injection 20 mg  20 mg Intramuscular Q12H PRN Micheal Likens, MD      . magnesium hydroxide (MILK OF MAGNESIA) suspension 30 mL  30 mL Oral Daily PRN Laveda Abbe, NP      . nicotine (NICODERM CQ - dosed in mg/24 hours) patch 21 mg  21 mg Transdermal Daily Laveda Abbe, NP   21 mg at 07/14/17 0825  . risperiDONE (RISPERDAL M-TABS) disintegrating tablet 2 mg  2 mg Oral Q8H PRN Micheal Likens, MD   2 mg at 07/13/17 1448  . risperiDONE (RISPERDAL) tablet 2 mg  2 mg Oral BID Micheal Likens, MD      . traZODone (DESYREL) tablet 50 mg  50 mg Oral QHS PRN Laveda Abbe, NP   50 mg at 07/13/17 2140    Lab Results: No results found for this or any previous visit (from the past 48 hour(s)).  Blood Alcohol level:  Lab Results  Component Value Date   ETH <10 07/11/2017   ETH <5 12/02/2014    Metabolic Disorder Labs: No results found for: HGBA1C, MPG No results found for: PROLACTIN No results found for: CHOL, TRIG, HDL, CHOLHDL, VLDL, LDLCALC  Physical Findings: AIMS: Facial and Oral Movements Muscles of Facial Expression: None, normal Lips and Perioral Area: None, normal Jaw: None, normal Tongue: None, normal,Extremity Movements Upper (arms, wrists, hands, fingers): None, normal Lower (legs, knees, ankles, toes): None, normal, Trunk Movements Neck,  shoulders, hips: None, normal, Overall Severity Severity of abnormal movements (highest score from questions above): None, normal Incapacitation due to abnormal movements: None, normal Patient's awareness of abnormal movements (rate only patient's report): No Awareness, Dental Status Current problems with teeth and/or dentures?: No Does patient usually wear dentures?: No  CIWA:    COWS:     Musculoskeletal: Strength & Muscle Tone: within normal limits Gait & Station: normal Patient leans: N/A  Psychiatric Specialty Exam: Physical Exam  Nursing note and vitals reviewed.   Review of Systems  Constitutional: Negative for chills and fever.  Respiratory: Negative for cough and shortness of  breath.   Cardiovascular: Negative for chest pain.  Gastrointestinal: Negative for abdominal pain, heartburn, nausea and vomiting.  Psychiatric/Behavioral: Positive for depression. Negative for hallucinations and suicidal ideas. The patient is nervous/anxious. The patient does not have insomnia.     Blood pressure 125/78, pulse 80, temperature 97.9 F (36.6 C), temperature source Oral, resp. rate 16, height 5' 11.5" (1.816 m), weight 70.3 kg (155 lb).Body mass index is 21.32 kg/m.  General Appearance: Casual and Fairly Groomed  Eye Contact:  Good  Speech:  Clear and Coherent and Normal Rate  Volume:  Normal  Mood:  Anxious and Euthymic  Affect:  Appropriate, Congruent and Constricted  Thought Process:  Coherent and Goal Directed  Orientation:  Full (Time, Place, and Person)  Thought Content:  Logical  Suicidal Thoughts:  No  Homicidal Thoughts:  No  Memory:  Immediate;   Good Recent;   Good Remote;   Good  Judgement:  Fair  Insight:  Fair  Psychomotor Activity:  Normal  Concentration:  Concentration: Fair  Recall:  Fair  Fund of Knowledge:  Fair  Language:  Fair  Akathisia:  No  Handed:    AIMS (if indicated):     Assets:  Manufacturing systems engineer Physical Health Resilience  ADL's:   Intact  Cognition:  WNL  Sleep:  Number of Hours: 5.75   Treatment Plan Summary: Daily contact with patient to assess and evaluate symptoms and progress in treatment and Medication management  - Bipolar I, current episode depressed with psychotic features             - Change risperdal  qAM +  po qhs to risperdal  po BID             - Continue risperdal M-Tab  po q8h prn psychosis  - anxiety             - Continue atarax  po q8h prn anxiety  - insomnia             - continue trazodone  po qhs prn insomnia  -Encourage participation in groups and the therapeutic milieu  -Discharge planning will be ongoing   Micheal Likens, MD 07/14/2017, 12:54 PM

## 2017-07-14 NOTE — Plan of Care (Signed)
  Coping: Ability to demonstrate self-control will improve 07/14/2017 2029 - Progressing by Delos HaringPhillips, Crystalann Korf A, RN Note Pt has not had any outburst on the unit this evening

## 2017-07-14 NOTE — [facility] Group Notes (Signed)
[facility] Group Notes:  (Nursing/MHT/Case Management/Adjunct)  Date:  07/14/2017  Time:  4:58 PM  Type of Therapy:  Psychoeducational Skills  Participation Level:  Active  Participation Quality:  Appropriate  Affect:  Appropriate  Cognitive:  Appropriate  Insight:  Appropriate  Engagement in Group:  Engaged  Modes of Intervention:  Discussion and Education  Summary of Progress/Problems: Group discussed crisis management.  Patient was attentive and participated.   Jerrye BushyLaRonica R Maryem Shuffler 07/14/2017, 4:58 PM

## 2017-07-14 NOTE — Progress Notes (Signed)
Recreation Therapy Notes  Date: 07/14/17 Time: 1000 Location: 500 Hall Dayroom  Group Topic: Goal Setting  Goal Area(s) Addresses:  Patient will be able to identify at least 3 life goals.  Patient will be able to identify benefit of investing in life goals.  Patient will be able to identify benefit of setting life goals.   Intervention: Worksheet, pencils  Activity: Goal Planning.  Patients were to set goals they wanted to accomplish in a week, in the next month, in the next year and five years from now.  Patients were to then identify obstacles to reaching goals, what they need to do achieve their goals and what they can start doing tomorrow to work towards their goals.  Education:  Discharge Planning, Coping Skills   Education Outcome: Acknowledges Education/In Group Clarification Provided/Needs Additional Education  Clinical Observations:  Pt did not attend group.   Caffie Sotto, LRT/CTRS         Damarri Rampy A 07/14/2017 12:32 PM 

## 2017-07-14 NOTE — Progress Notes (Signed)
Patient denies SI, HI and AVH this shift.  Patient has attended groups and participated in unit activities.  Patient has had no incidents of dyscontrol   Assess patient for safety, offer medications as prescribed, engage patient in 1:1 staff talks.   Continue to monitor as planned. Patient able to contract for safety 

## 2017-07-14 NOTE — Progress Notes (Signed)
D: Pt informed the writer that "he had a good day, because he spoke to his 4 kids".  Stated, that "it's good because he hasn't lashed out since being here". Stated that he's glad he came because prior to coming he would "wake up in a bad mood and blame everybody else for it". Stated, "now I know I'm bipolar". Pt has no questions or concerns.    A:  Support and encouragement was offered. 15 min checks continued for safety.  R: Pt remains safe.

## 2017-07-14 NOTE — Progress Notes (Signed)
Adult Psychoeducational Group Note  Date:  07/14/2017 Time:  9:13 PM  Group Topic/Focus:  Wrap-Up Group:   The focus of this group is to help patients review their daily goal of treatment and discuss progress on daily workbooks.  Participation Level:  Active  Participation Quality:  Appropriate  Affect:  Appropriate  Cognitive:  Appropriate  Insight: Appropriate  Engagement in Group:  Engaged  Modes of Intervention:  Discussion  Additional Comments: The patient expressed that he attended groups.The patient also said that rates today a 10.  Octavio Mannshigpen, Yovan Leeman Lee 07/14/2017, 9:13 PM

## 2017-07-15 MED ORDER — BENZOCAINE 10 % MT GEL
Freq: Three times a day (TID) | OROMUCOSAL | Status: DC | PRN
  Administered 2017-07-15 – 2017-07-16 (×2): via OROMUCOSAL
  Filled 2017-07-15: qty 9.4

## 2017-07-15 MED ORDER — IBUPROFEN 600 MG PO TABS
600.0000 mg | ORAL_TABLET | Freq: Four times a day (QID) | ORAL | Status: DC | PRN
  Administered 2017-07-15 – 2017-07-18 (×4): 600 mg via ORAL
  Filled 2017-07-15 (×4): qty 1

## 2017-07-15 NOTE — Progress Notes (Signed)
[facility]  07/15/2017 11:40 AM Antonio Wright  MRN:  [id number] Subjective:    Antonio Wright is a 32 y/o M with history of treatment for depression and substance use who was admitted on IVC after patient had contacted police and reported SI with plan to jump from a bridge; pt was agitated on the scene and was apologetic but then also kicking the police vehicle. Pt had also reported history of feeling that his body was taken over by an entity called "Dillie" who would react when pt was in times of emotional distress.Pt was started on risperdal to address mood and psychotic symptoms. Pt had ongoing depression, anxiety, and experience of interacting with AH during his stay, but he noted improvement of his symptoms with use of risperdal both scheduled and PRN. His dose of risperdal has been titrated up during his stay, and pt continues to report incremental improvement of his presenting symptoms.  Today upon evaluation, pt states, "I've got a tooth ache today, but I'm okay. My mood is starting to feel a little overwhelmed, but it's better since I've been here. I'm learning not to react so much and listen." Pt shares example that he spoke with his girlfriend over the phone and she shared bad news that they had lost their housing, but pt was able to redirect his initial irritability and remain calm during the conversation. He explains, "I was able to listen and use my coping skills, which I don't think I would have been able to do before." Pt reports since yesterday he had occurrence of AH which he describes as individual "Dillie." He explains, "Dillie came to me again when I was getting irritated someone, but I was able to separate myself and take some time, and he went away." Pt denies SI/HI/VH. He notes that his medications have been helpful, and he would like to continue them without changes at this time. He had no further questions, comments, or concerns.  Principal Problem: Bipolar I  disorder, current or most recent episode depressed, with psychotic features [facility](HCC) Diagnosis:   Patient Active Problem List   Diagnosis Date Noted  . Bipolar I disorder, current or most recent episode depressed, with psychotic features (HCC) [F31.5] 07/11/2017  . Forearm laceration, right, initial encounter [S51.811A] 02/21/2017  . Substance induced mood disorder (HCC) [F19.94] 12/03/2014  . 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 Chronic back pain     Past Surgical History:  Procedure Laterality Date  . I&D EXTREMITY Right 02/21/2017   Procedure: Wound Exploration Right Forearm And Repair As Needed;  Surgeon: Bradly Bienenstockrtmann, Fred, MD;  [facility];  Service: Orthopedics;  Laterality: Right;   Family History:  Family History  Problem Relation Age of Onset  . Diabetes Father   . Diabetes Other    Family Psychiatric  History: see H&P Social History:  Social History   Substance and Sexual Activity  Alcohol Use Yes   Comment: occassionally     Social History   Substance and Sexual Activity  Drug Use Yes  . Types: Marijuana   Comment: occ    Social History   Socioeconomic History  . Marital status: Single    Spouse name: None  . Number of children: None  . Years of education: None  . Highest education level: None  Social Needs  . Physicist, medicalinancial resource  strain: None  . Food insecurity - worry: None  . Food insecurity - inability: None  . Transportation needs - medical: None  . Transportation needs - non-medical: None  Occupational History  . None  Tobacco Use  . Smoking status: Current Every Day Smoker    Packs/day: 0.50    Types: Cigarettes  . Smokeless tobacco: Never Used  Substance and Sexual Activity  . Alcohol use: Yes    Comment: occassionally  . Drug use: Yes    Types: Marijuana    Comment: occ  . Sexual activity:  Yes  Other Topics Concern  . None  Social History Narrative  . None   Additional Social History:                         Sleep: Good  Appetite:  Good  Current Medications: Current Facility-Administered Medications  Medication Dose Route Frequency Provider Last Rate Last Dose  . acetaminophen (TYLENOL) tablet 650 mg  650 mg Oral Q6H PRN Laveda Abbe, NP   650 mg at 07/15/17 1610  . alum & mag hydroxide-simeth (MAALOX/MYLANTA) 200-200-20 MG/5ML suspension 30 mL  30 mL Oral Q4H PRN Laveda Abbe, NP      . benzocaine (ORAJEL) 10 % mucosal gel   Mouth/Throat TID PRN Micheal Likens, MD      . hydrOXYzine (ATARAX/VISTARIL) tablet 25 mg  25 mg Oral TID PRN Laveda Abbe, NP   25 mg at 07/14/17 2124  . ibuprofen (ADVIL,MOTRIN) tablet 600 mg  600 mg Oral Q6H PRN Micheal Likens, MD   600 mg at 07/15/17 1033  . LORazepam (ATIVAN) tablet 1 mg  1 mg Oral Q12H PRN Micheal Likens, MD       And  . ziprasidone (GEODON) injection 20 mg  20 mg Intramuscular Q12H PRN Micheal Likens, MD      . magnesium hydroxide (MILK OF MAGNESIA) suspension 30 mL  30 mL Oral Daily PRN Laveda Abbe, NP      . nicotine (NICODERM CQ - dosed in mg/24 hours) patch 21 mg  21 mg Transdermal Daily Laveda Abbe, NP   21 mg at 07/15/17 0737  . risperiDONE (RISPERDAL M-TABS) disintegrating tablet 2 mg  2 mg Oral Q8H PRN Micheal Likens, MD   2 mg at 07/14/17 1810  . risperiDONE (RISPERDAL) tablet 2 mg  2 mg Oral BID Micheal Likens, MD   2 mg at 07/15/17 0737  . traZODone (DESYREL) tablet 50 mg  50 mg Oral QHS PRN Laveda Abbe, NP   50 mg at 07/14/17 2125    Lab Results: No results found for this or any previous visit (from the past 48 hour(s)).  Blood Alcohol level:  Lab Results  Component Value Date   ETH <10 07/11/2017   ETH <5 12/02/2014    Metabolic Disorder Labs: No results found for: HGBA1C,  MPG No results found for: PROLACTIN No results found for: CHOL, TRIG, HDL, CHOLHDL, VLDL, LDLCALC  Physical Findings: AIMS: Facial and Oral Movements Muscles of Facial Expression: None, normal Lips and Perioral Area: None, normal Jaw: None, normal Tongue: None, normal,Extremity Movements Upper (arms, wrists, hands, fingers): None, normal Lower (legs, knees, ankles, toes): None, normal, Trunk Movements Neck, shoulders, hips: None, normal, Overall Severity Severity of abnormal movements (highest score from questions above): None, normal Incapacitation due to abnormal movements: None, normal Patient's awareness of abnormal movements (rate only patient's report): No  Awareness, Dental Status Current problems with teeth and/or dentures?: No Does patient usually wear dentures?: No  CIWA:    COWS:     Musculoskeletal: Strength & Muscle Tone: within normal limits Gait & Station: normal Patient leans: N/A  Psychiatric Specialty Exam: Physical Exam  Nursing note and vitals reviewed.   Review of Systems  Constitutional: Negative for chills and fever.  HENT:       Tooth pain   Respiratory: Negative for cough and shortness of breath.   Cardiovascular: Negative for chest pain.  Gastrointestinal: Negative for abdominal pain, heartburn, nausea and vomiting.  Psychiatric/Behavioral: Positive for depression and hallucinations. Negative for suicidal ideas. The patient is nervous/anxious. The patient does not have insomnia.     Blood pressure 135/77, pulse 74, temperature 97.7 F (36.5 C), temperature source Oral, resp. rate 20, height 5' 11.5" (1.816 m), weight 70.3 kg (155 lb).Body mass index is 21.32 kg/m.  General Appearance: Casual and Fairly Groomed  Eye Contact:  Good  Speech:  Clear and Coherent and Normal Rate  Volume:  Normal  Mood:  Anxious and Depressed  Affect:  Appropriate, Congruent and Constricted  Thought Process:  Coherent and Goal Directed  Orientation:  Full (Time,  Place, and Person)  Thought Content:  Hallucinations: Auditory  Suicidal Thoughts:  No  Homicidal Thoughts:  No  Memory:  Immediate;   Fair Recent;   Fair Remote;   Fair  Judgement:  Fair  Insight:  Fair  Psychomotor Activity:  Normal  Concentration:  Concentration: Fair  Recall:  [employer] of Knowledge:  Fair  Language:  Fair  Akathisia:  No  Handed:    AIMS (if indicated):     Assets:  Communication Skills Desire for Improvement Physical Health Resilience Social Support  ADL's:  Intact  Cognition:  WNL  Sleep:  Number of Hours: 6.75   Treatment Plan Summary: Daily contact with patient to assess and evaluate symptoms and progress in treatment and Medication management  - Bipolar I, current episode depressed with psychotic features - Continue risperdal 2mg  po BID - Continue risperdal M-Tab 2mg  po q8h prn psychosis  - anxiety - Continue atarax 25mg  po q8h prn anxiety  - insomnia - continue trazodone 50mg  po qhs prn insomnia  -Tooth pain    - Start orajel 15% apply to affected area of mouth TID prn pain     - Start ibuprofen 600mg  po q6h prn moderate pain  -Encourage participation in groups and the therapeutic milieu  -Discharge planning will be ongoing  Micheal Likens, MD 07/15/2017, 11:40 AM

## 2017-07-15 NOTE — [facility] Group Notes (Signed)
[facility] LCSW Group Therapy 07/15/2017 1:15pm  Type of Therapy: Group Therapy- Feelings Around Discharge & Establishing a Supportive Framework  Participation Level:  Active  Description of Group:   What is a supportive framework? What does it look like feel like and how do I discern it from and unhealthy non-supportive network? Learn how to cope when supports are not helpful and don't support you. Discuss what to do when your family/friends are not supportive.  Summary of Patient Progress  Antonio Wright came to group and remained there the entire time.  He spoke about being ok with staying in the hospital over the weekend.  He believes the extra time here will be helpful for him.  He also shared about being nervous for the future.  He participated in the meditation activity at the end of the group.  He enjoys using meditation often to help him feel less anxious.   Therapeutic Modalities:   Cognitive Behavioral Therapy Person-Centered Therapy Motivational Interviewing   [name] Jaimeson Gopal, Student-Social Work 07/15/2017 1:42 PM

## 2017-07-15 NOTE — Progress Notes (Signed)
Adult Psychoeducational Group Note  Date:  07/15/2017 Time:  8:38 PM  Group Topic/Focus:  Wrap-Up Group:   The focus of this group is to help patients review their daily goal of treatment and discuss progress on daily workbooks.  Participation Level:  Active  Participation Quality:  Appropriate  Affect:  Appropriate  Cognitive:  Oriented  Insight: Appropriate  Engagement in Group:  Engaged  Modes of Intervention:  Socialization and Support  Additional Comments:  Patient attended and participated in group tonight. He reports having tooth pain all day. He was given some medication but the effect wear off quickly and he cannot have anything for another 6 hours.  Lita MainsFrancis, Kamri Gotsch [facility] LPD[facility]osta 07/15/2017, 8:38 PM

## 2017-07-15 NOTE — Plan of Care (Signed)
  Activity: Interest or engagement in leisure activities will improve 07/15/2017 2245 - Progressing by Delos HaringPhillips, Breon Rehm A, RN Note Pt visible in dayroom watching TV this evening

## 2017-07-15 NOTE — Progress Notes (Signed)
Recreation Therapy Notes  Date: 07/15/17 Time: 1000 Location: 500 Hall Dayroom  Group Topic: Communication, Team Building, Problem Solving  Goal Area(s) Addresses:  Patient will effectively work with peer towards shared goal.  Patient will identify skill used to make activity successful.  Patient will identify how skills used during activity can be used to reach post d/c goals.   Intervention: STEM Activity   Activity: [employer] Tower. In teams, patients were asked to build the tallest freestanding tower possible out of 15 pipe cleaners. Systematically resources were removed, for example patient ability to use both hands and patient ability to verbally communicate.    Education: Pharmacist, communityocial Skills, Building control surveyorDischarge Planning.   Education Outcome: Acknowledges education/In group clarification offered/Needs additional education.   Clinical Observations/Feedback: Pt did not attend group.    Caroll RancherMarjette Nailea Whitehorn, LRT/CTRS     Lillia AbedLindsay, Rubena Roseman A 07/15/2017 11:04 AM

## 2017-07-15 NOTE — Progress Notes (Signed)
D: Pt denies SI/HI/AVH. Pt is pleasant and cooperative. Pt stated he was depressed earlier, but was doing a little better this evening  A: Pt was offered support and encouragement. Pt was given scheduled medications. Pt was encourage to attend groups. Q 15 minute checks were done for safety.   R:Pt attends groups and interacts well with peers and staff. Pt is taking medication. Pt has no complaints.Pt receptive to treatment and safety maintained on unit.

## 2017-07-16 DIAGNOSIS — G4700 Insomnia, unspecified: Secondary | ICD-10-CM

## 2017-07-16 DIAGNOSIS — F1290 Cannabis use, unspecified, uncomplicated: Secondary | ICD-10-CM

## 2017-07-16 NOTE — [facility] Group Notes (Signed)
[facility] Group Notes:  (Nursing/MHT/Case Management/Adjunct)  Date:  07/16/2017  Time:  9:06 AM  Type of Therapy:  Orientation/Goals group  Participation Level:  Did Not Attend  Participation Quality:  Did Not Attend  Affect:  Did Not Attend  Cognitive:  Did Not Attend  Insight:  None  Engagement in Group:  Did Not Attend  Modes of Intervention:  Did Not Attend  Summary of Progress/Problems: Pt did not attend patient self inventory group/orientation group.   Jacquelyne BalintForrest, Kayron Hicklin Shanta 07/16/2017, 9:06 AM

## 2017-07-16 NOTE — Progress Notes (Signed)
[facility]  07/16/2017 3:42 PM Antonio Wright  MRN:  [id number]  Subjective: Antonio Wright reports, "I feel so tired this morning. There are so much stuff going on here, people screaming. That makes me want to stay in my room. I'm here because of suicidal thoughts because the voices almost took over me. I felt overwhelmed. Now that the voices are kind of calm, I don't need no triggers. That is the reason I'm not going back in the day room. I might say something to this people' like stop all these screaming men".   Objective: Antonio Wright is a 32 y/o M with history of treatment for depression and substance use who was admitted on IVC after patient had contacted police and reported SI with plan to jump from a bridge; pt was agitated on the scene and was apologetic but then also kicking the police vehicle. Pt had also reported history of feeling that his body was taken over by an entity called "Dillie" who would react when pt was in times of emotional distress.Pt was started on risperdal to address mood and psychotic symptoms. Pt had ongoing depression, anxiety, and experience of interacting with AH during his stay, but he noted improvement of his symptoms with use of risperdal both scheduled and PRN. His dose of risperdal has been titrated up during his stay, and pt continues to report incremental improvement of his presenting symptoms.  Today upon evaluation, pt states, "I feel so tired this morning.  I'm learning not to react to so much stuff, but, there are a lot of screaming going on here. That makes me want to stay in my room. I'm here because of suicidal thoughts because the voices almost took over me. I felt overwhelmed. Now that the voices are kind of calm, I don't need no triggers. That is the reason I'm not going back in the day room. I might say something to these people, like stop all these screaming men"  Pt denies SI/HI/VH. He notes that his medications have been helpful, and he would  like to continue them without changes at this time. He had no further questions, comments, or concerns. He is encourage to continue to attend & participate in the group sessions while applying his coping skills.  Principal Problem: Bipolar I disorder, current or most recent episode depressed, with psychotic features [facility](HCC) Diagnosis:   Patient Active Problem List   Diagnosis Date Noted  . Bipolar I disorder, current or most recent episode depressed, with psychotic features (HCC) [F31.5] 07/11/2017  . Forearm laceration, right, initial encounter [S51.811A] 02/21/2017  . Substance induced mood disorder (HCC) [F19.94] 12/03/2014  . 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 Chronic back pain     Past Surgical History:  Procedure Laterality Date  . I&D EXTREMITY Right 02/21/2017   Procedure: Wound Exploration Right Forearm And Repair As Needed;  Surgeon: Bradly Bienenstockrtmann, Fred, MD;  [facility];  Service: Orthopedics;  Laterality: Right;   Family History:  Family History  Problem Relation Age of Onset  . Diabetes Father   . Diabetes Other    Family Psychiatric  History: See H&P  Social History:  Social History   Substance and Sexual Activity  Alcohol Use Yes   Comment: occassionally     Social History   Substance and Sexual Activity  Drug Use Yes  .  Types: Marijuana   Comment: occ    Social History   Socioeconomic History  . Marital status: Single    Spouse name: None  . Number of children: None  . Years of education: None  . Highest education level: None  Social Needs  . Financial resource strain: None  . Food insecurity - worry: None  . Food insecurity - inability: None  . Transportation needs - medical: None  . Transportation needs - non-medical: None  Occupational History  . None  Tobacco Use  . Smoking status: Current Every  Day Smoker    Packs/day: 0.50    Types: Cigarettes  . Smokeless tobacco: Never Used  Substance and Sexual Activity  . Alcohol use: Yes    Comment: occassionally  . Drug use: Yes    Types: Marijuana    Comment: occ  . Sexual activity: Yes  Other Topics Concern  . None  Social History Narrative  . None   Additional Social History:   Sleep: Good  Appetite:  Good  Current Medications: Current Facility-Administered Medications  Medication Dose Route Frequency Provider Last Rate Last Dose  . acetaminophen (TYLENOL) tablet 650 mg  650 mg Oral Q6H PRN Laveda Abbe, NP   650 mg at 07/15/17 2113  . alum & mag hydroxide-simeth (MAALOX/MYLANTA) 200-200-20 MG/5ML suspension 30 mL  30 mL Oral Q4H PRN Laveda Abbe, NP      . benzocaine (ORAJEL) 10 % mucosal gel   Mouth/Throat TID PRN Micheal Likens, MD      . hydrOXYzine (ATARAX/VISTARIL) tablet 25 mg  25 mg Oral TID PRN Laveda Abbe, NP   25 mg at 07/16/17 1201  . ibuprofen (ADVIL,MOTRIN) tablet 600 mg  600 mg Oral Q6H PRN Micheal Likens, MD   600 mg at 07/16/17 0953  . LORazepam (ATIVAN) tablet 1 mg  1 mg Oral Q12H PRN Micheal Likens, MD       And  . ziprasidone (GEODON) injection 20 mg  20 mg Intramuscular Q12H PRN Micheal Likens, MD      . magnesium hydroxide (MILK OF MAGNESIA) suspension 30 mL  30 mL Oral Daily PRN Laveda Abbe, NP      . nicotine (NICODERM CQ - dosed in mg/24 hours) patch 21 mg  21 mg Transdermal Daily Laveda Abbe, NP   21 mg at 07/16/17 0816  . risperiDONE (RISPERDAL M-TABS) disintegrating tablet 2 mg  2 mg Oral Q8H PRN Micheal Likens, MD   2 mg at 07/16/17 1200  . risperiDONE (RISPERDAL) tablet 2 mg  2 mg Oral BID Micheal Likens, MD   2 mg at 07/16/17 0815  . traZODone (DESYREL) tablet 50 mg  50 mg Oral QHS PRN Laveda Abbe, NP   50 mg at 07/15/17 2113   Lab Results: No results found for this or any  previous visit (from the past 48 hour(s)).  Blood Alcohol level:  Lab Results  Component Value Date   ETH <10 07/11/2017   ETH <5 12/02/2014   Metabolic Disorder Labs: No results found for: HGBA1C, MPG No results found for: PROLACTIN No results found for: CHOL, TRIG, HDL, CHOLHDL, VLDL, LDLCALC  Physical Findings: AIMS: Facial and Oral Movements Muscles of Facial Expression: None, normal Lips and Perioral Area: None, normal Jaw: None, normal Tongue: None, normal,Extremity Movements Upper (arms, wrists, hands, fingers): None, normal Lower (legs, knees, ankles, toes): None, normal, Trunk Movements Neck, shoulders, hips: None, normal, Overall Severity Severity  of abnormal movements (highest score from questions above): None, normal Incapacitation due to abnormal movements: None, normal Patient's awareness of abnormal movements (rate only patient's report): No Awareness, Dental Status Current problems with teeth and/or dentures?: No Does patient usually wear dentures?: No  CIWA:    COWS:     Musculoskeletal: Strength & Muscle Tone: within normal limits Gait & Station: normal Patient leans: N/A  Psychiatric Specialty Exam: Physical Exam  Nursing note and vitals reviewed.   Review of Systems  Constitutional: Negative for chills and fever.  HENT:       Tooth pain   Respiratory: Negative for cough and shortness of breath.   Cardiovascular: Negative for chest pain.  Gastrointestinal: Negative for abdominal pain, heartburn, nausea and vomiting.  Psychiatric/Behavioral: Positive for depression and hallucinations. Negative for suicidal ideas. The patient is nervous/anxious. The patient does not have insomnia.     Blood pressure 126/76, pulse 100, temperature 97.8 F (36.6 C), temperature source Oral, resp. rate 20, height 5' 11.5" (1.816 m), weight 70.3 kg (155 lb).Body mass index is 21.32 kg/m.  General Appearance: Casual and Fairly Groomed  Eye Contact:  Good  Speech:   Clear and Coherent and Normal Rate  Volume:  Normal  Mood:  Anxious and Depressed  Affect:  Appropriate, Congruent and Constricted  Thought Process:  Coherent and Goal Directed  Orientation:  Full (Time, Place, and Person)  Thought Content:  Hallucinations: Auditory  Suicidal Thoughts:  No  Homicidal Thoughts:  No  Memory:  Immediate;   Fair Recent;   Fair Remote;   Fair  Judgement:  Fair  Insight:  Fair  Psychomotor Activity:  Normal  Concentration:  Concentration: Fair  Recall:  [employer] of Knowledge:  Fair  Language:  Fair  Akathisia:  No  Handed:    AIMS (if indicated):     Assets:  Communication Skills Desire for Improvement Physical Health Resilience Social Support  ADL's:  Intact  Cognition:  WNL  Sleep:  Number of Hours: 6.75   Treatment Plan Summary: Daily contact with patient to assess and evaluate symptoms and progress in treatment and Medication management: Continue inpatient hospitalization.  -Will continue today 07/16/2017 plan as below except where it is noted.  - Bipolar I, current episode depressed with psychotic features - Continue risperdal 2mg  po BID - Continue risperdal M-Tab 2mg  po q8h prn psychosis  - anxiety - Continue atarax 25mg  po q8h prn anxiety  - insomnia - continue trazodone 50mg  po qhs prn insomnia  -Tooth pain    - Continue orajel 15% apply to affected area of mouth TID prn pain     - Continue ibuprofen 600mg  po q6h prn moderate pain  -Encourage participation in groups and the therapeutic milieu  -Discharge planning will be ongoing  Armandina Stammer, NP, PMHNP, FNP-BC. 07/16/2017, 3:42 PMPatient ID: Antonio Rio, male   DOB: 1985-07-11, 32 y.o.   MRN: [id number]

## 2017-07-16 NOTE — Plan of Care (Signed)
  Safety: Periods of time without injury will increase 07/16/2017 2216 - Progressing by Delos HaringPhillips, Elyjah Hazan A, RN Note Pt safe on the unit at this time
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
## 2017-07-16 NOTE — [facility] Group Notes (Signed)
[facility] Group Notes:  (Nursing/MHT/Case Management/Adjunct)  Date:  07/16/2017  Time:  9:58 AM  Type of Therapy:  Psychoeducational Skills  Participation Level:  Did Not Attend  Participation Quality:  Did not attend  Affect:  Did not attend  Cognitive:  Did not attend  Insight:  None  Engagement in Group:  Did not attend  Modes of Intervention:  Did not attend  Summary of Progress/Problems: Pt did not attend Psychoeducational group with topic anger management.   Jacquelyne BalintForrest, Jeb Schloemer Shanta 07/16/2017, 9:58 AM

## 2017-07-16 NOTE — Progress Notes (Cosign Needed)
Adult Psychoeducational Group Note  Date:  07/16/2017 Time:  8:45 PM  Group Topic/Focus:  Wrap-Up Group:   The focus of this group is to help patients review their daily goal of treatment and discuss progress on daily workbooks.  Participation Level:  Active  Participation Quality:  Appropriate  Affect:  Appropriate  Cognitive:  Appropriate  Insight: Appropriate  Engagement in Group:  Engaged  Modes of Intervention:  Discussion  Additional Comments: The patient expressed that he attend group.The patient also said that he rates today a 6.  Octavio Mannshigpen, Grantham Hippert Lee 07/16/2017, 8:45 PM
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
## 2017-07-17 NOTE — [facility] Group Notes (Signed)
Adult Psychoeducational Group Note  Date:  07/17/2017 Time:  10:39 PM  Group Topic/Focus:  Wrap-Up Group:   The focus of this group is to help patients review their daily goal of treatment and discuss progress on daily workbooks.  Participation Level:  Active  Participation Quality:  Appropriate and Attentive  Affect:  Appropriate  Cognitive:  Alert and Appropriate  Insight: Appropriate and Good  Engagement in Group:  Engaged  Modes of Intervention:  Discussion and Education  Additional Comments:  Pt attended and participated in wrap up group this evening. Pt had a content day and stayed away from pt that gave them conflict and had not arguments. Pt also made a discharge plan. Pt goal was to apologize to their father and they also received an apology from their father. A positive noted by the pt was that they spoke with their 30 year old daughter and their father.   Antonio NettersOctavia A Reaghan Kawa 07/17/2017, 10:39 PM

## 2017-07-17 NOTE — Progress Notes (Signed)
[facility] MD Progress Note  07/17/2017 3:47 PM Antonio Wright  MRN:  [id number]  Subjective: Antonio Wright reports, "I feel okay, just ready to get out of here. I'm kind of excited that I'm about to leave this place".   Objective: Antonio Wright is a 32 y/o M with history of treatment for depression and substance use who was admitted on IVC after patient had contacted police and reported SI with plan to jump from a bridge; pt was agitated on the scene and was apologetic but then also kicking the police vehicle. Pt had also reported history of feeling that his body was taken over by an entity called "Dillie" who would react when pt was in times of emotional distress.Pt was started on risperdal to address mood and psychotic symptoms. Pt had ongoing depression, anxiety, and experience of interacting with AH during his stay, but he noted improvement of his symptoms with use of risperdal both scheduled and PRN. His dose of risperdal has been titrated up during his stay, and pt continues to report incremental improvement of his presenting symptoms.  Today upon evaluation, pt states, "I feel okay, just staying in my room.  I'm learning not to react to so much stuff going here, but, there are a lot of screaming going on here. That makes me want to stay in my room. I'm here because of suicidal thoughts because the voices almost took over me. I felt overwhelmed. Now that the voices are kind of calm, I don't need no triggers. That is the reason I'm avoiding the day room again today.  Pt denies SI/HI/VH. He notes that his medications have been helpful, and he would like to continue them without changes at this time. He had no further questions, comments, or concerns. He is encourage to continue to attend & participate in the group sessions while applying his coping skills.  Principal Problem: Bipolar I disorder, current or most recent episode depressed, with psychotic features [facility]) Diagnosis:   Patient Active Problem List    Diagnosis Date Noted  . Bipolar I disorder, current or most recent episode depressed, with psychotic features (HCC) [F31.5] 07/11/2017  . Forearm laceration, right, initial encounter [S51.811A] 02/21/2017  . Substance induced mood disorder (HCC) [F19.94] 12/03/2014  . 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 Chronic back pain     Past Surgical History:  Procedure Laterality Date  . I&D EXTREMITY Right 02/21/2017   Procedure: Wound Exploration Right Forearm And Repair As Needed;  Surgeon: Bradly Bienenstock, MD;  Location: [facility] OR;  Service: Orthopedics;  Laterality: Right;   Family History:  Family History  Problem Relation Age of Onset  . Diabetes Father   . Diabetes Other    Family Psychiatric  History: See H&P  Social History:  Social History   Substance and Sexual Activity  Alcohol Use Yes   Comment: occassionally     Social History   Substance and Sexual Activity  Drug Use Yes  . Types: Marijuana   Comment: occ    Social History   Socioeconomic History  . Marital status: Single    Spouse name: None  . Number of children: None  . Years of education: None  . Highest education level: None  Social Needs  . Financial resource strain: None  . Food insecurity - worry: None  . Food insecurity - inability: None  .  Transportation needs - medical: None  . Transportation needs - non-medical: None  Occupational History  . None  Tobacco Use  . Smoking status: Current Every Day Smoker    Packs/day: 0.50    Types: Cigarettes  . Smokeless tobacco: Never Used  Substance and Sexual Activity  . Alcohol use: Yes    Comment: occassionally  . Drug use: Yes    Types: Marijuana    Comment: occ  . Sexual activity: Yes  Other Topics Concern  . None  Social History Narrative  . None   Additional Social History:   Sleep:  Good  Appetite:  Good  Current Medications: Current Facility-Administered Medications  Medication Dose Route Frequency Provider Last Rate Last Dose  . acetaminophen (TYLENOL) tablet 650 mg  650 mg Oral Q6H PRN Laveda Abbe, NP   650 mg at 07/17/17 1329  . alum & mag hydroxide-simeth (MAALOX/MYLANTA) 200-200-20 MG/5ML suspension 30 mL  30 mL Oral Q4H PRN Laveda Abbe, NP      . benzocaine (ORAJEL) 10 % mucosal gel   Mouth/Throat TID PRN Micheal Likens, MD      . hydrOXYzine (ATARAX/VISTARIL) tablet 25 mg  25 mg Oral TID PRN Laveda Abbe, NP   25 mg at 07/16/17 2107  . ibuprofen (ADVIL,MOTRIN) tablet 600 mg  600 mg Oral Q6H PRN Micheal Likens, MD   600 mg at 07/16/17 0953  . LORazepam (ATIVAN) tablet 1 mg  1 mg Oral Q12H PRN Micheal Likens, MD       And  . ziprasidone (GEODON) injection 20 mg  20 mg Intramuscular Q12H PRN Micheal Likens, MD      . magnesium hydroxide (MILK OF MAGNESIA) suspension 30 mL  30 mL Oral Daily PRN Laveda Abbe, NP      . nicotine (NICODERM CQ - dosed in mg/24 hours) patch 21 mg  21 mg Transdermal Daily Laveda Abbe, NP   21 mg at 07/17/17 0913  . risperiDONE (RISPERDAL M-TABS) disintegrating tablet 2 mg  2 mg Oral Q8H PRN Micheal Likens, MD   2 mg at 07/16/17 1200  . risperiDONE (RISPERDAL) tablet 2 mg  2 mg Oral BID Micheal Likens, MD   2 mg at 07/17/17 0914  . traZODone (DESYREL) tablet 50 mg  50 mg Oral QHS PRN Laveda Abbe, NP   50 mg at 07/16/17 2107   Lab Results: No results found for this or any previous visit (from the past 48 hour(s)).  Blood Alcohol level:  Lab Results  Component Value Date   ETH <10 07/11/2017   ETH <5 12/02/2014   Metabolic Disorder Labs: No results found for: HGBA1C, MPG No results found for: PROLACTIN No results found for: CHOL, TRIG, HDL, CHOLHDL, VLDL, LDLCALC  Physical Findings: AIMS: Facial and Oral  Movements Muscles of Facial Expression: None, normal Lips and Perioral Area: None, normal Jaw: None, normal Tongue: None, normal,Extremity Movements Upper (arms, wrists, hands, fingers): None, normal Lower (legs, knees, ankles, toes): None, normal, Trunk Movements Neck, shoulders, hips: None, normal, Overall Severity Severity of abnormal movements (highest score from questions above): None, normal Incapacitation due to abnormal movements: None, normal Patient's awareness of abnormal movements (rate only patient's report): No Awareness, Dental Status Current problems with teeth and/or dentures?: No Does patient usually wear dentures?: No  CIWA:    COWS:     Musculoskeletal: Strength & Muscle Tone: within normal limits Gait & Station: normal Patient leans: N/A  Psychiatric Specialty Exam: Physical Exam  Nursing note and vitals reviewed.   Review of Systems  Constitutional: Negative for chills and fever.  HENT:       Tooth pain   Respiratory: Negative for cough and shortness of breath.   Cardiovascular: Negative for chest pain.  Gastrointestinal: Negative for abdominal pain, heartburn, nausea and vomiting.  Psychiatric/Behavioral: Positive for depression and hallucinations. Negative for suicidal ideas. The patient is nervous/anxious. The patient does not have insomnia.     Blood pressure (!) 114/54, pulse (!) 108, temperature 97.8 F (36.6 C), temperature source Oral, resp. rate 20, height 5' 11.5" (1.816 m), weight 70.3 kg (155 lb).Body mass index is 21.32 kg/m.  General Appearance: Casual and Fairly Groomed  Eye Contact:  Good  Speech:  Clear and Coherent and Normal Rate  Volume:  Normal  Mood:  Anxious and Depressed  Affect:  Appropriate, Congruent and Constricted  Thought Process:  Coherent and Goal Directed  Orientation:  Full (Time, Place, and Person)  Thought Content:  Hallucinations: Auditory  Suicidal Thoughts:  No  Homicidal Thoughts:  No  Memory:  Immediate;    Fair Recent;   Fair Remote;   Fair  Judgement:  Fair  Insight:  Fair  Psychomotor Activity:  Normal  Concentration:  Concentration: Fair  Recall:  FiservFair  Fund of Knowledge:  Fair  Language:  Fair  Akathisia:  No  Handed:    AIMS (if indicated):     Assets:  Communication Skills Desire for Improvement Physical Health Resilience Social Support  ADL's:  Intact  Cognition:  WNL  Sleep:  Number of Hours: 6.75   Treatment Plan Summary: Daily contact with patient to assess and evaluate symptoms and progress in treatment and Medication management: Continue inpatient hospitalization.  -Will continue today 07/17/2017 plan as below except where it is noted.  - Bipolar I, current episode depressed with psychotic features - Continue risperdal 2mg  po BID - Continue risperdal M-Tab 2mg  po q8h prn psychosis  - anxiety - Continue atarax 25mg  po q8h prn anxiety  - insomnia - continue trazodone 50mg  po qhs prn insomnia  -Tooth pain    - Continue orajel 15% apply to affected area of mouth TID prn pain     - Continue ibuprofen 600mg  po q6h prn moderate pain  -Encourage participation in groups and the therapeutic milieu  -Discharge planning will be ongoing  Antonio StammerAgnes Bri Wakeman, NP, PMHNP, FNP-BC. 07/17/2017, 3:47 PMPatient ID: Antonio Wright, male   DOB: Feb 03, 1986, 32 y.o.   MRN: [id number]

## 2017-07-18 MED ORDER — RISPERIDONE 2 MG PO TABS: 2.0000 mg | ORAL_TABLET | Freq: Two times a day (BID) | ORAL | 0 refills | Status: DC

## 2017-07-18 MED ORDER — HYDROXYZINE HCL 25 MG PO TABS: 25.0000 mg | ORAL_TABLET | Freq: Three times a day (TID) | ORAL | 0 refills | Status: DC | PRN

## 2017-07-18 MED ORDER — NICOTINE 21 MG/24HR TD PT24: 21.0000 mg | MEDICATED_PATCH | Freq: Every day | TRANSDERMAL | 0 refills | Status: DC

## 2017-07-18 MED ORDER — TRAZODONE HCL 50 MG PO TABS: 50.0000 mg | ORAL_TABLET | Freq: Every evening | ORAL | 0 refills | Status: DC | PRN

## 2017-07-18 NOTE — Progress Notes (Signed)
Recreation Therapy Notes  Date: 07/18/17 Time: 0955 Location: [address] Dayroom  Group Topic: Coping Skills  Goal Area(s) Addresses:  Patient will be able to identify positive coping skills. Patient will be able to benefits of using coping skills post d/c.  Behavioral Response: Engaged  Intervention: AT&TWhite board, dry erase marker, worksheet  Activity: Mind map.  LRT and patients filled in the eight boxes together with anxiety, anger, overwhelmed, exhausted, lack of patience, sickness, loss of loved one and relationships.  Patients were to then come up with at least 3 coping skills for each situation.  The group would then reconvene and LRT would write the coping skills on the board.   Education: PharmacologistCoping Skills, Building control surveyorDischarge Planning.   Education Outcome: Acknowledges understanding/In group clarification offered/Needs additional education.   Clinical Observations/Feedback:  Pt was bright and pleasant.  Pt stated his coping skills were "counting to 100, video games, go to the gym, distractions, respect, take a walk and chores".   Caroll RancherMarjette Izsak Meir, LRT/CTRS         Caroll RancherLindsay, Pavneet Markwood A 07/18/2017 1:34 PM

## 2017-07-18 NOTE — [facility] Suicide Risk Assessment (Signed)
[facility] Discharge Suicide Risk Assessment   Principal Problem: Bipolar I disorder, current or most recent episode depressed, with psychotic features [facility]) Discharge Diagnoses:  Patient Active Problem List   Diagnosis Date Noted  . Bipolar I disorder, current or most recent episode depressed, with psychotic features (HCC) [F31.5] 07/11/2017  . Forearm laceration, right, initial encounter [S51.811A] 02/21/2017  . Substance induced mood disorder (HCC) [F19.94] 12/03/2014  . 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  Respiratory: Negative for cough and shortness of breath.   Cardiovascular: Negative for chest pain.  Gastrointestinal: Negative for abdominal pain, heartburn, nausea and vomiting.  Psychiatric/Behavioral: Negative for depression, hallucinations and suicidal ideas. The patient is not nervous/anxious.     Blood pressure 118/82, pulse (!) 105, temperature 98 F (36.7 C), temperature source Oral, resp. rate 20, height 5' 11.5" (1.816 m), weight 70.3 kg (155 lb).Body mass index is 21.32 kg/m.  General Appearance: Casual and Fairly Groomed  Patent attorney::  Good  Speech:  Clear and Coherent and Normal Rate  Volume:  Normal  Mood:  Euthymic  Affect:  Appropriate, Congruent and Constricted  Thought Process:  Coherent and Goal Directed  Orientation:  Full (Time, Place, and Person)  Thought Content:  Logical  Suicidal Thoughts:  No  Homicidal Thoughts:  No  Memory:  Immediate;   Fair Recent;   Fair Remote;   Fair  Judgement:  Fair  Insight:  Fair  Psychomotor Activity:  Normal  Concentration:  Good  Recall:  Good  Fund of Knowledge:Good  Language: Good  Akathisia:  No  Handed:    AIMS (if indicated):     Assets:  Communication  Skills Physical Health Resilience  Sleep:  Number of Hours: 6.25  Cognition: WNL  ADL's:  Intact   Mental Status Per Nursing Assessment::   On Admission:  Self-harm thoughts  Demographic Factors:  Male and Low socioeconomic status  Loss Factors: Financial problems/change in socioeconomic status  Historical Factors: Impulsivity  Risk Reduction Factors:   Positive social support, Positive therapeutic relationship and Positive coping skills or problem solving skills  Continued Clinical Symptoms:  Bipolar Disorder:   Depressive phase  Cognitive Features That Contribute To Risk:  None    Suicide Risk:  Minimal: No identifiable suicidal ideation.  Patients presenting with no risk factors but with morbid ruminations; may be classified as minimal risk based on the severity of the depressive symptoms  Follow-up Information    [facility] Follow up on 07/20/2017.   Why:  Follow up is scheduled for March 6th, 2019 at 8:15am.  Please bring you ID, Social Security card, and any proof of household income.  Contact information: [address] [place] [address] [phone]         Subjective Data:  Antonio Wright is a 32 y/o M with history of treatment for depression and substance use who was admitted on IVC after patient had contacted police and reported SI with plan to jump from a bridge; pt was agitated on the scene and was apologetic but then also kicking the police vehicle. Pt had also reported history of feeling that his body was taken over by an entity called "Dillie" who would react when pt was in times of emotional distress.Pt was started on risperdal to address mood  and psychotic symptoms.Pt had ongoing depression, anxiety, and experience of interacting with AH during his stay, but he noted improvement of his symptoms with use of risperdal both scheduled and PRN. His dose of risperdal has been titrated up during his stay, and pt continues to report incremental improvement of  his presenting symptoms.  Today upon evaluation, pt shares about his weekend, "It was bad because one of the other patient's said something to me over and over, and it really bothered me." Pt describes being antagonized by a peer after sharing a personal detail during group, and he also describes utilizing his coping skills to help limit his reaction. He notes that he did say something back to the peer after multiple times of being antagonized, but he notes that his reaction was much more subdued than it would have been in the past. Pt is sleeping well. His appetite is good. He denies any physical complaints. He denies SI/HI/AH/VH. He is tolerating his medications well, and he would like to continue his current regimen without changes. He was able to engage in safety planning including [facility] or contact emergency services if he feels unable to maintain his own safety or the safety of others. Pt had no further questions, comments, or concerns.    Plan Of Care/Follow-up recommendations:   -Discharge to outpatient level of care  - Bipolar I, current episode depressed with psychotic features - Continue risperdal 2mg  po BID -Continuerisperdal M-Tab 2mg  po q8h prn psychosis  - anxiety - Continue atarax 25mg  po q8h prn anxiety  - insomnia - continue trazodone 50mg  po qhs prn insomnia  Activity:  as tolerated Diet:  normal Tests:  NA Other:  see above for DC plan  Micheal Likenshristopher T Kacper Cartlidge, MD 07/18/2017, 10:02 AM
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
## 2017-07-18 NOTE — Progress Notes (Signed)
Recreation Therapy Notes  INPATIENT RECREATION TR PLAN  Patient Details Name: Antonio Wright MRN: [id number] DOB: 1986/04/01 Today's Date: 07/18/2017  Rec Therapy Plan Is patient appropriate for Therapeutic Recreation?: Yes Treatment times per week: about 3 days Estimated Length of Stay: 5-7 days TR Treatment/Interventions: Group participation (Comment)  Discharge Criteria Pt will be discharged from therapy if:: Discharged Treatment plan/goals/alternatives discussed and agreed upon by:: Patient/family  Discharge Summary Short term goals set: Pt will be able to identify benefits of using appropriate anger management techniques. Short term goals met: Complete Progress toward goals comments: Groups attended Which groups?: Coping skills, Anger management Reason goals not met: None Therapeutic equipment acquired: N/A Reason patient discharged from therapy: Discharge from hospital Pt/family agrees with progress & goals achieved: Yes Date patient discharged from therapy: 07/18/17    Victorino Sparrow, LRT/CTRS  Ria Comment, Kerrianne Jeng A 07/18/2017, 10:57 AM

## 2017-07-18 NOTE — Progress Notes (Signed)
D: Pt A & O X4. Denies SI, HI, AVH and pain when assessed. Presents with flat affect on approach; brightens up on interactions. Per pt "I'm just anxious because I want to leave". Visible in milieu at intervals during shift. Reports good appetite with normal energy and good concentration level on self inventory sheet. Rates his depression 0/10, anxiety 0/10 and 4/10. Pt was picked up in the lobby by his friend.  A: Scheduled and PRN medications given as ordered with verbal education and effects monitored. D/C instructions including medication samples, prescriptions and follow up appointment reviewed with pt. All belongings in locker 35 returned to pt at time of departure. Q 15 minutes safety checks maintained on and off unit till time of d/c.  R: Pt verbalized understanding related to d/c instructions. Signed belonging sheet in agreement with items received. Compliant with medications, denies adverse drug reactions when assessed. Ambulatory with a steady gait. Appears to be in no physical distress at time of d/c.

## 2017-07-18 NOTE — Progress Notes (Addendum)
  [facility] Discharge Plan :  Will you be returning to the same living situation after discharge:  Yes,  with mother At discharge, do you have transportation home?: No. Pt reports he will pay for his own taxi ride.  Pt declines bus pass. (Pt later informed CSW that he has a friend who will be coming to pick him up.) Do you have the ability to pay for your medications: No. [facility].  Release of information consent forms completed and in the chart;  Patient's signature needed at discharge.  Patient to Follow up at: Follow-up Information    [facility] Follow up on 07/20/2017.   Why:  Follow up is scheduled for March 6th, 2019 at 8:15am.  Please bring you ID, Social Security card, and any proof of household income.  Contact information: [address] [place] [address] [phone]           Next level of care provider [facility] Link:no  Safety Planning and Suicide Prevention discussed: Yes,  with mother  Have you used any form of tobacco in the last 30 days? (Cigarettes, Smokeless Tobacco, Cigars, and/or Pipes): Yes  Has patient been referred to the Quitline?: Patient refused referral  Patient has been referred for addiction treatment: Yes  Lorri FrederickWierda, Elzena Muston Jon, LCSW 07/18/2017, 8:55 AM

## 2017-07-18 NOTE — Plan of Care (Signed)
Pt was able to identify triggers and benefits of using appropriate anger management techniques.   Caroll RancherMarjette Rennie Rouch, LRT/CTRS

## 2017-08-03 ENCOUNTER — Encounter (HOSPITAL_COMMUNITY): Payer: Self-pay | Admitting: *Deleted

## 2017-08-03 ENCOUNTER — Emergency Department (HOSPITAL_COMMUNITY)
Admission: EM | Admit: 2017-08-03 | Discharge: 2017-08-04 | Disposition: A | Discharge status: Home / Self Care | Payer: Self-pay | Attending: Emergency Medicine | Admitting: Emergency Medicine

## 2017-08-03 ENCOUNTER — Other Ambulatory Visit: Payer: Self-pay

## 2017-08-03 DIAGNOSIS — F17210 Nicotine dependence, cigarettes, uncomplicated: Secondary | ICD-10-CM | POA: Insufficient documentation

## 2017-08-03 DIAGNOSIS — F319 Bipolar disorder, unspecified: Secondary | ICD-10-CM | POA: Insufficient documentation

## 2017-08-03 DIAGNOSIS — R45851 Suicidal ideations: Secondary | ICD-10-CM

## 2017-08-03 DIAGNOSIS — F4321 Adjustment disorder with depressed mood: Secondary | ICD-10-CM

## 2017-08-03 LAB — CBC WITH DIFFERENTIAL/PLATELET
BASOS ABS: 0 10*3/uL (ref 0.0–0.1)
BASOS PCT: 1 %
Eosinophils Absolute: 0.1 10*3/uL (ref 0.0–0.7)
Eosinophils Relative: 3 %
HEMATOCRIT: 42.4 % (ref 39.0–52.0)
HEMOGLOBIN: 14.4 g/dL (ref 13.0–17.0)
Lymphocytes Relative: 37 %
Lymphs Abs: 1.9 10*3/uL (ref 0.7–4.0)
MCH: 29.9 pg (ref 26.0–34.0)
MCHC: 34 g/dL (ref 30.0–36.0)
MCV: 88 fL (ref 78.0–100.0)
Monocytes Absolute: 0.3 10*3/uL (ref 0.1–1.0)
Monocytes Relative: 6 %
NEUTROS ABS: 2.8 10*3/uL (ref 1.7–7.7)
NEUTROS PCT: 53 %
Platelets: 246 10*3/uL (ref 150–400)
RBC: 4.82 MIL/uL (ref 4.22–5.81)
RDW: 14.1 % (ref 11.5–15.5)
WBC: 5.2 10*3/uL (ref 4.0–10.5)

## 2017-08-03 LAB — SALICYLATE LEVEL

## 2017-08-03 LAB — ACETAMINOPHEN LEVEL: Acetaminophen (Tylenol), Serum: <10 ug/mL — ABNORMAL LOW (ref 10–30)

## 2017-08-03 LAB — COMPREHENSIVE METABOLIC PANEL
ALK PHOS: 94 U/L (ref 38–126)
ALT: 39 U/L (ref 17–63)
AST: 21 U/L (ref 15–41)
Albumin: 4.2 g/dL (ref 3.5–5.0)
Anion gap: 8 (ref 5–15)
BILIRUBIN TOTAL: 0.3 mg/dL (ref 0.3–1.2)
BUN: 8 mg/dL (ref 6–20)
CALCIUM: 9.2 mg/dL (ref 8.9–10.3)
CO2: 26 mmol/L (ref 22–32)
CREATININE: 1.11 mg/dL (ref 0.61–1.24)
Chloride: 105 mmol/L (ref 101–111)
GFR calc non Af Amer: >60 mL/min (ref >60)
GLUCOSE: 100 mg/dL — AB (ref 65–99)
Potassium: 4.3 mmol/L (ref 3.5–5.1)
Sodium: 139 mmol/L (ref 135–145)
TOTAL PROTEIN: 7.3 g/dL (ref 6.5–8.1)

## 2017-08-03 LAB — RAPID URINE DRUG SCREEN, HOSP PERFORMED
Amphetamines: NOT DETECTED
BARBITURATES: NOT DETECTED
Benzodiazepines: NOT DETECTED
Cocaine: NOT DETECTED
OPIATES: NOT DETECTED
Tetrahydrocannabinol: POSITIVE — AB

## 2017-08-03 LAB — ETHANOL: Alcohol, Ethyl (B): <10 mg/dL (ref <10)

## 2017-08-03 MED ORDER — TRAZODONE HCL 50 MG PO TABS: 50.0000 mg | ORAL_TABLET | Freq: Every evening | ORAL | Status: DC | PRN

## 2017-08-03 MED ORDER — ZOLPIDEM TARTRATE 5 MG PO TABS: 5.0000 mg | ORAL_TABLET | Freq: Every evening | ORAL | Status: DC | PRN

## 2017-08-03 MED ORDER — HYDROXYZINE HCL 25 MG PO TABS: 25.0000 mg | ORAL_TABLET | Freq: Three times a day (TID) | ORAL | Status: DC | PRN

## 2017-08-03 MED ORDER — ALUM & MAG HYDROXIDE-SIMETH 200-200-20 MG/5ML PO SUSP: 30.0000 mL | Freq: Four times a day (QID) | ORAL | Status: DC | PRN

## 2017-08-03 MED ORDER — RISPERIDONE 1 MG PO TABS
2.0000 mg | ORAL_TABLET | Freq: Two times a day (BID) | ORAL | Status: DC
  Administered 2017-08-03: 2 mg via ORAL
  Filled 2017-08-03: qty 2

## 2017-08-03 MED ORDER — NICOTINE 21 MG/24HR TD PT24
21.0000 mg | MEDICATED_PATCH | Freq: Every day | TRANSDERMAL | Status: DC
  Administered 2017-08-03: 21 mg via TRANSDERMAL
  Filled 2017-08-03: qty 1

## 2017-08-03 MED ORDER — ONDANSETRON HCL 4 MG PO TABS: 4.0000 mg | ORAL_TABLET | Freq: Three times a day (TID) | ORAL | Status: DC | PRN

## 2017-08-03 MED ORDER — ACETAMINOPHEN 325 MG PO TABS: 650.0000 mg | ORAL_TABLET | ORAL | Status: DC | PRN

## 2017-08-03 NOTE — ED Provider Notes (Signed)
Patient placed in Quick Look pathway, seen and evaluated   Chief Complaint: depressed mood, suicidal  HPI:   intermittently suicidal, has required hospitalization.  Suicidal thoughts are fluctuating, last thought of suicide late last night.  He was looking up how to hang himself, was able to talk to family member who helped him through the night. Called mobile crisis who brought him to ED. He wants help establishing care with counselor or [facility], thought mobile crisis line would help him.      ROS: depressed mood, no active SI or HI.   Physical Exam:   Gen: No distress  Neuro: Awake and Alert  Skin: Warm    Focused Exam: Cooperative. Depressed mood. VS WNL.    Given labile mood and SI patient will benefit from psych clearance and resources to establish care with outpt provider. Pt agreeable to stay voluntarily. Initiation of care has begun. The patient has been counseled on the process, plan, and necessity for staying for the completion/evaluation, and the remainder of the medical screening examination    Liberty HandyGibbons, Mikie Misner J, PA-C 08/03/17 1528    Melene PlanFloyd, Dan, DO 08/03/17 2301

## 2017-08-03 NOTE — ED Notes (Signed)
Patient completing TTS at this time.
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
## 2017-08-03 NOTE — ED Notes (Signed)
Meal ordered for patient.

## 2017-08-03 NOTE — ED Triage Notes (Addendum)
Brought to ED by Mobile Crisis unit due to having feelings of SI with plan yesterday - no thoughts today and currently denies. Pt states he has no plan or feeling today. States he called [facility] today 'for a pep talk and not to come back in'. Pt is attempting to get appt with [facility]. Pt has very cooperative. Good eye contact. States within last few weeks attempted to harm self. Recent loss of finances and family. Pt states he wants to get better for his kids. Aware and agrees with plan of care
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
## 2017-08-04 ENCOUNTER — Encounter (HOSPITAL_COMMUNITY): Payer: Self-pay | Admitting: Registered Nurse

## 2017-08-04 DIAGNOSIS — F17210 Nicotine dependence, cigarettes, uncomplicated: Secondary | ICD-10-CM

## 2017-08-04 DIAGNOSIS — R45851 Suicidal ideations: Secondary | ICD-10-CM

## 2017-08-04 DIAGNOSIS — F4321 Adjustment disorder with depressed mood: Secondary | ICD-10-CM

## 2017-08-04 NOTE — ED Notes (Signed)
Breakfast tray ordered 

## 2017-08-04 NOTE — ED Notes (Signed)
Declined W/C at D/C and was escorted to lobby by RN. 

## 2017-08-04 NOTE — Discharge Instructions (Addendum)
Follow up with your physician
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
## 2017-08-04 NOTE — ED Triage Notes (Signed)
TTS done 

## 2018-04-14 ENCOUNTER — Telehealth (HOSPITAL_COMMUNITY): Payer: Self-pay

## 2019-02-19 IMAGING — DX DG FOREARM 2V*R*
1 series · 1 of 1 positions shown · non-contrast
Comparison: None.

CLINICAL DATA: Soft tissue laceration.

EXAM:
RIGHT FOREARM - 1 VIEW

[forearmbone]
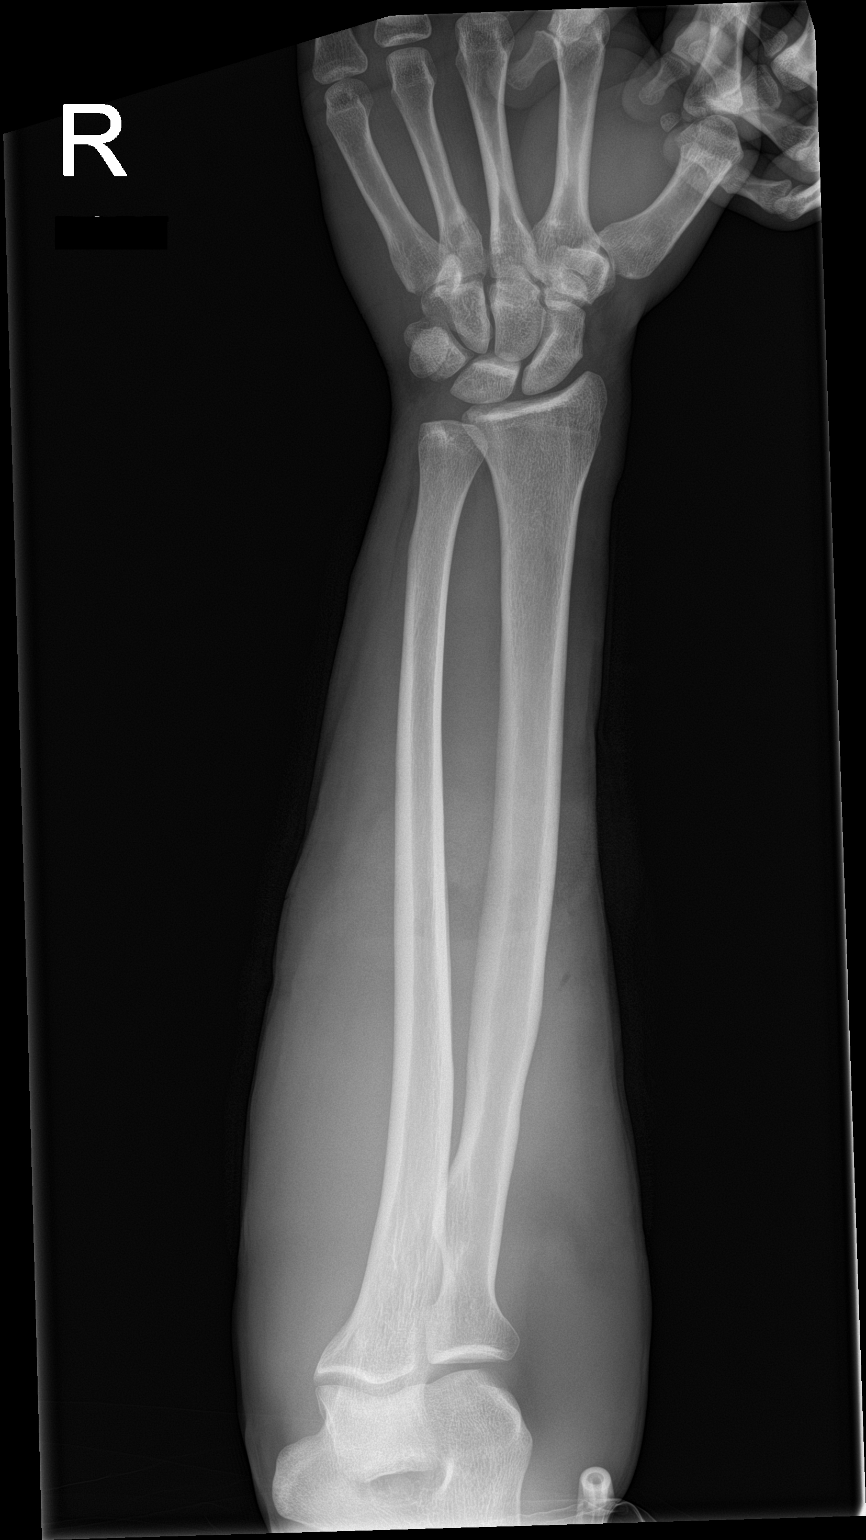

[1 of 1 positions shown; findings below may reference images not displayed]

FINDINGS: There is no evidence of fracture or other focal bone lesions. Soft
tissue laceration. No visible foreign body in the soft tissues.
There is a small amount of air in the soft tissues.
IMPRESSION: Laceration.  Otherwise negative.  No visible foreign body.

## 2020-12-15 ENCOUNTER — Other Ambulatory Visit: Payer: Self-pay

## 2020-12-16 ENCOUNTER — Ambulatory Visit (HOSPITAL_COMMUNITY): Payer: Self-pay

## 2022-12-17 ENCOUNTER — Encounter (HOSPITAL_COMMUNITY): Payer: Self-pay

## 2022-12-17 ENCOUNTER — Ambulatory Visit (HOSPITAL_COMMUNITY)
Admission: EM | Admit: 2022-12-17 | Discharge: 2022-12-17 | Disposition: A | Discharge status: Home / Self Care | Payer: Self-pay | Attending: Family Medicine | Admitting: Family Medicine

## 2022-12-17 DIAGNOSIS — R112 Nausea with vomiting, unspecified: Secondary | ICD-10-CM | POA: Insufficient documentation

## 2022-12-17 DIAGNOSIS — F17210 Nicotine dependence, cigarettes, uncomplicated: Secondary | ICD-10-CM | POA: Insufficient documentation

## 2022-12-17 DIAGNOSIS — Z1152 Encounter for screening for COVID-19: Secondary | ICD-10-CM | POA: Insufficient documentation

## 2022-12-17 DIAGNOSIS — J069 Acute upper respiratory infection, unspecified: Secondary | ICD-10-CM | POA: Insufficient documentation

## 2022-12-17 DIAGNOSIS — R197 Diarrhea, unspecified: Secondary | ICD-10-CM | POA: Insufficient documentation

## 2022-12-17 HISTORY — DX: Anxiety disorder, unspecified: F41.9

## 2022-12-17 MED ORDER — BENZONATATE 100 MG PO CAPS: 100.0000 mg | ORAL_CAPSULE | Freq: Three times a day (TID) | ORAL | 0 refills | Status: AC | PRN

## 2022-12-17 MED ORDER — ONDANSETRON 4 MG PO TBDP: 4.0000 mg | ORAL_TABLET | Freq: Three times a day (TID) | ORAL | 0 refills | Status: AC | PRN

## 2022-12-17 NOTE — ED Triage Notes (Signed)
Patient reports that he started off with a cough yesterday and today he has had N/V/D.  Patiaent states he has taken Nyquil and Midol yesterday.

## 2022-12-17 NOTE — Discharge Instructions (Signed)
Ondansetron dissolved in the mouth every 8 hours as needed for nausea or vomiting. Clear liquids(water, gatorade/pedialyte, ginger ale/sprite, chicken broth/soup) and bland things(crackers/toast, rice, potato, bananas) to eat. Avoid acidic foods like lemon/lime/orange/tomato, and avoid greasy/spicy foods.  Take benzonatate 100 mg, 1 tab every 8 hours as needed for cough.   You have been swabbed for COVID, and the test will result in the next 24 hours. Our staff will call you if positive. If the COVID test is positive, you should quarantine until you are fever free for 24 hours and you are starting to feel better, and then take added precautions for the next 5 days, such as physical distancing/wearing a mask and good hand hygiene/washing.   

## 2022-12-17 NOTE — ED Provider Notes (Signed)
[facility]    CSN: [id number] Arrival date & time: 12/17/22  1806      History   Chief Complaint Chief Complaint  Patient presents with   Cough   Emesis   Diarrhea    HPI Antonio Wright is a 37 y.o. male.    Cough Emesis Associated symptoms: cough and diarrhea   Diarrhea Associated symptoms: vomiting   Here for congestion and cough and vomiting and diarrhea. The congestion and cough began yesterday.  Then overnight he had several episodes of emesis between 130 and 2:30 AM this morning.  He has not had any emesis since then but he has been nauseated.  He is also having frequent liquid stool.  No blood in any output.  He had some subjective fever earlier today, but he has not take anything for the fever and his temperature here is 98.8.  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 Authorizing Provider  benzonatate (TESSALON) 100 MG capsule Take 1 capsule (100 mg total) by mouth 3 (three) times daily as needed for cough. 12/17/22  Yes Zenia Resides, MD  ondansetron (ZOFRAN-ODT) 4 MG disintegrating tablet Take 1 tablet (4 mg total) by mouth  every 8 (eight) hours as needed for nausea or vomiting. 12/17/22  Yes Zenia Resides, MD    Family History Family History  Problem Relation Age of Onset   Diabetes Father    Diabetes Other     Social History Social History   Tobacco Use   Smoking status: Every Day    Current packs/day: 1.00    Types: Cigarettes   Smokeless tobacco: Never  Vaping Use   Vaping status: Never Used  Substance Use Topics   Alcohol use: No    Comment: occassionally   Drug use: Yes    Types: Marijuana    Comment: occ     Allergies   Patient has no known allergies.   Review of Systems Review of Systems  Respiratory:  Positive for cough.   Gastrointestinal:  Positive for diarrhea and vomiting.     Physical Exam Triage Vital Signs ED Triage Vitals [12/17/22 1918]  Encounter Vitals Group     BP 119/66     Systolic BP Percentile      Diastolic BP Percentile      Pulse Rate (!) 58     Resp 14     Temp 98.8 F (37.1 C)     Temp Source Oral     SpO2 98 %     Weight      Height      Head  Circumference      Peak Flow      Pain Score 0     Pain Loc      Pain Education      Exclude from Growth Chart    No data found.  Updated Vital Signs BP 119/66 (BP Location: Left Arm)   Pulse (!) 58   Temp 98.8 F (37.1 C) (Oral)   Resp 14   SpO2 98%   Visual Acuity Right Eye Distance:   Left Eye Distance:   Bilateral Distance:    Right Eye Near:   Left Eye Near:    Bilateral Near:     Physical Exam Vitals reviewed.  Constitutional:      General: He is not in acute distress.    Appearance: He is not ill-appearing, toxic-appearing or diaphoretic.  HENT:     Nose: Congestion present.     Mouth/Throat:     Mouth: Mucous membranes are moist.     Pharynx: No oropharyngeal exudate or posterior oropharyngeal erythema.  Eyes:     Extraocular Movements: Extraocular movements intact.     Conjunctiva/sclera: Conjunctivae normal.     Pupils: Pupils are equal, round, and reactive to  light.  Cardiovascular:     Rate and Rhythm: Normal rate and regular rhythm.     Heart sounds: No murmur heard. Pulmonary:     Effort: No respiratory distress.     Breath sounds: No stridor. No wheezing, rhonchi or rales.  Musculoskeletal:     Cervical back: Neck supple.  Lymphadenopathy:     Cervical: No cervical adenopathy.  Skin:    Capillary Refill: Capillary refill takes less than 2 seconds.     Coloration: Skin is not jaundiced or pale.  Neurological:     General: No focal deficit present.     Mental Status: He is alert and oriented to person, place, and time.  Psychiatric:        Behavior: Behavior normal.      UC Treatments / Results  Labs (all labs ordered are listed, but only abnormal results are displayed) Labs Reviewed  SARS CORONAVIRUS 2 (TAT 6-24 HRS)    EKG   Radiology No results found.  Procedures Procedures (including critical care time)  Medications Ordered in UC Medications - No data to display  Initial Impression / Assessment and Plan / UC Course  I have reviewed the triage vital signs and the nursing notes.  Pertinent labs & imaging results that were available during my care of the patient were reviewed by me and considered in my medical decision making (see chart for details).        Zofran is sent in for the nausea and we discussed clear liquids.  Tessalon Perles are sent in for the cough.  COVID swab is done and if positive he can see it on MyChart and will know to isolate. Final Clinical Impressions(s) / UC Diagnoses   Final diagnoses:  Viral upper respiratory tract infection  Nausea vomiting and diarrhea     Discharge Instructions      Ondansetron dissolved in the mouth every 8 hours as needed for nausea or vomiting. Clear liquids(water, gatorade/pedialyte, ginger ale/sprite, chicken broth/soup) and bland things(crackers/toast, rice, potato, bananas) to eat. Avoid acidic foods like lemon/lime/orange/tomato, and avoid  greasy/spicy foods.  Take benzonatate 100 mg, 1 tab every 8 hours as needed for cough.   You have been swabbed for COVID, and the test will result in the next 24 hours. Our staff will  call you if positive. If the COVID test is positive, you should quarantine until you are fever free for 24 hours and you are starting to feel better, and then take added precautions for the next 5 days, such as physical distancing/wearing a mask and good hand hygiene/washing.      ED Prescriptions     Medication Sig Dispense Auth. Provider   benzonatate (TESSALON) 100 MG capsule Take 1 capsule (100 mg total) by mouth 3 (three) times daily as needed for cough. 21 capsule Zenia Resides, MD   ondansetron (ZOFRAN-ODT) 4 MG disintegrating tablet Take 1 tablet (4 mg total) by mouth every 8 (eight) hours as needed for nausea or vomiting. 10 tablet Marlinda Mike Janace Aris, MD      PDMP not reviewed this encounter.   Zenia Resides, MD 12/17/22 [phone]
